# Patient Record
Sex: Male | Born: 1935 | Race: Black or African American | Hispanic: No | Marital: Married | Smoking: Former smoker
Health system: Southern US, Community
[De-identification: ages and names within clinical notes are randomized; demographics above are authoritative.]

## PROBLEM LIST (undated history)

## (undated) DIAGNOSIS — I219 Acute myocardial infarction, unspecified: Secondary | ICD-10-CM

## (undated) DIAGNOSIS — E119 Type 2 diabetes mellitus without complications: Secondary | ICD-10-CM

## (undated) DIAGNOSIS — I10 Essential (primary) hypertension: Secondary | ICD-10-CM

## (undated) DIAGNOSIS — J449 Chronic obstructive pulmonary disease, unspecified: Secondary | ICD-10-CM

## (undated) DIAGNOSIS — E785 Hyperlipidemia, unspecified: Secondary | ICD-10-CM

## (undated) HISTORY — PX: CHOLECYSTECTOMY: SHX55

## (undated) HISTORY — DX: Essential (primary) hypertension: I10

## (undated) HISTORY — DX: Hyperlipidemia, unspecified: E78.5

## (undated) HISTORY — DX: Type 2 diabetes mellitus without complications: E11.9

---

## 2004-05-16 ENCOUNTER — Emergency Department (HOSPITAL_COMMUNITY): Admission: EM | Admit: 2004-05-16 | Discharge: 2004-05-17 | Payer: Self-pay | Admitting: Emergency Medicine

## 2004-05-20 ENCOUNTER — Emergency Department (HOSPITAL_COMMUNITY): Admission: EM | Admit: 2004-05-20 | Discharge: 2004-05-20 | Payer: Self-pay | Admitting: Emergency Medicine

## 2009-03-08 ENCOUNTER — Emergency Department (HOSPITAL_COMMUNITY): Admission: EM | Admit: 2009-03-08 | Discharge: 2009-03-08 | Payer: Self-pay | Admitting: *Deleted

## 2010-03-07 ENCOUNTER — Emergency Department (HOSPITAL_COMMUNITY): Admission: EM | Admit: 2010-03-07 | Discharge: 2010-03-08 | Payer: Self-pay | Admitting: Emergency Medicine

## 2011-01-29 LAB — COMPREHENSIVE METABOLIC PANEL
CO2: 29 mEq/L (ref 19–32)
GFR calc non Af Amer: 54 mL/min — ABNORMAL LOW (ref >60)
Glucose, Bld: 147 mg/dL — ABNORMAL HIGH (ref 70–99)
Sodium: 139 mEq/L (ref 135–145)
Total Bilirubin: 0.9 mg/dL (ref 0.3–1.2)

## 2011-01-29 LAB — GLUCOSE, CAPILLARY: Glucose-Capillary: 187 mg/dL — ABNORMAL HIGH (ref 70–99)

## 2011-01-29 LAB — CBC
HCT: 47.3 % (ref 39.0–52.0)
RBC: 5 MIL/uL (ref 4.22–5.81)
RDW: 13.7 % (ref 11.5–15.5)
WBC: 10.9 10*3/uL — ABNORMAL HIGH (ref 4.0–10.5)

## 2011-01-29 LAB — POCT I-STAT 3, ART BLOOD GAS (G3+)
Acid-Base Excess: 5 mmol/L — ABNORMAL HIGH (ref 0.0–2.0)
O2 Saturation: 95 %
TCO2: 29 mmol/L (ref 0–100)
pH, Arterial: 7.5 — ABNORMAL HIGH (ref 7.350–7.450)
pO2, Arterial: 66 mmHg — ABNORMAL LOW (ref 80.0–100.0)

## 2011-01-29 LAB — CULTURE, BLOOD (ROUTINE X 2)

## 2011-01-29 LAB — URINALYSIS, ROUTINE W REFLEX MICROSCOPIC
Bilirubin Urine: NEGATIVE
Glucose, UA: NEGATIVE mg/dL
Specific Gravity, Urine: 1.015 (ref 1.005–1.030)
pH: 7.5 (ref 5.0–8.0)

## 2011-01-29 LAB — DIFFERENTIAL
Basophils Absolute: 0 10*3/uL (ref 0.0–0.1)
Eosinophils Absolute: 0.2 10*3/uL (ref 0.0–0.7)
Eosinophils Relative: 1 % (ref 0–5)
Lymphs Abs: 1.2 10*3/uL (ref 0.7–4.0)
Monocytes Absolute: 1.4 10*3/uL — ABNORMAL HIGH (ref 0.1–1.0)
Neutro Abs: 8.1 10*3/uL — ABNORMAL HIGH (ref 1.7–7.7)

## 2011-01-29 LAB — URINE CULTURE
Colony Count: NO GROWTH
Culture: NO GROWTH

## 2011-02-20 LAB — BASIC METABOLIC PANEL
BUN: 19 mg/dL (ref 6–23)
CO2: 22 mEq/L (ref 19–32)
Calcium: 8 mg/dL — ABNORMAL LOW (ref 8.4–10.5)
Creatinine, Ser: 1.47 mg/dL (ref 0.4–1.5)
GFR calc non Af Amer: 47 mL/min — ABNORMAL LOW (ref >60)
Glucose, Bld: 117 mg/dL — ABNORMAL HIGH (ref 70–99)
Sodium: 135 mEq/L (ref 135–145)

## 2011-02-20 LAB — COMPREHENSIVE METABOLIC PANEL
ALT: 27 U/L (ref 0–53)
AST: 27 U/L (ref 0–37)
Albumin: 3.5 g/dL (ref 3.5–5.2)
Alkaline Phosphatase: 79 U/L (ref 39–117)
BUN: 20 mg/dL (ref 6–23)
CO2: 22 mEq/L (ref 19–32)
Calcium: 8.8 mg/dL (ref 8.4–10.5)
Chloride: 101 mEq/L (ref 96–112)
GFR calc Af Amer: 42 mL/min — ABNORMAL LOW (ref >60)
Glucose, Bld: 123 mg/dL — ABNORMAL HIGH (ref 70–99)
Total Protein: 6.9 g/dL (ref 6.0–8.3)

## 2011-02-20 LAB — URINALYSIS, ROUTINE W REFLEX MICROSCOPIC
Glucose, UA: NEGATIVE mg/dL
Nitrite: NEGATIVE
Protein, ur: NEGATIVE mg/dL
Specific Gravity, Urine: 1.018 (ref 1.005–1.030)
Urobilinogen, UA: 0.2 mg/dL (ref 0.0–1.0)

## 2011-02-20 LAB — CBC
Hemoglobin: 14.9 g/dL (ref 13.0–17.0)
MCHC: 34.8 g/dL (ref 30.0–36.0)
MCV: 90.2 fL (ref 78.0–100.0)
RDW: 14.8 % (ref 11.5–15.5)
WBC: 8.8 10*3/uL (ref 4.0–10.5)

## 2011-02-20 LAB — POCT CARDIAC MARKERS
Myoglobin, poc: 165 ng/mL (ref 12–200)
Troponin i, poc: <0.05 ng/mL (ref 0.00–0.09)

## 2011-02-20 LAB — URINE MICROSCOPIC-ADD ON

## 2011-02-20 LAB — CULTURE, BLOOD (ROUTINE X 2)

## 2011-02-20 LAB — PROTIME-INR
INR: 1.2 (ref 0.00–1.49)
Prothrombin Time: 15.5 seconds — ABNORMAL HIGH (ref 11.6–15.2)

## 2011-02-20 LAB — DIFFERENTIAL: Monocytes Relative: 11 % (ref 3–12)

## 2011-02-20 LAB — URINE CULTURE

## 2012-01-13 ENCOUNTER — Encounter (HOSPITAL_COMMUNITY): Payer: Self-pay

## 2012-01-13 ENCOUNTER — Emergency Department (HOSPITAL_COMMUNITY): Payer: Medicare Other

## 2012-01-13 ENCOUNTER — Other Ambulatory Visit: Payer: Self-pay

## 2012-01-13 ENCOUNTER — Inpatient Hospital Stay (HOSPITAL_COMMUNITY)
Admission: EM | Admit: 2012-01-13 | Discharge: 2012-01-15 | DRG: 190 | Disposition: A | Discharge status: Home Health | Payer: Medicare Other | Attending: Internal Medicine | Admitting: Internal Medicine

## 2012-01-13 DIAGNOSIS — E119 Type 2 diabetes mellitus without complications: Secondary | ICD-10-CM

## 2012-01-13 DIAGNOSIS — Z79899 Other long term (current) drug therapy: Secondary | ICD-10-CM

## 2012-01-13 DIAGNOSIS — E785 Hyperlipidemia, unspecified: Secondary | ICD-10-CM | POA: Diagnosis present

## 2012-01-13 DIAGNOSIS — I252 Old myocardial infarction: Secondary | ICD-10-CM

## 2012-01-13 DIAGNOSIS — Z7982 Long term (current) use of aspirin: Secondary | ICD-10-CM

## 2012-01-13 DIAGNOSIS — J441 Chronic obstructive pulmonary disease with (acute) exacerbation: Principal | ICD-10-CM | POA: Diagnosis present

## 2012-01-13 DIAGNOSIS — I10 Essential (primary) hypertension: Secondary | ICD-10-CM | POA: Diagnosis present

## 2012-01-13 DIAGNOSIS — J189 Pneumonia, unspecified organism: Secondary | ICD-10-CM

## 2012-01-13 DIAGNOSIS — Z87891 Personal history of nicotine dependence: Secondary | ICD-10-CM

## 2012-01-13 DIAGNOSIS — Z6826 Body mass index (BMI) 26.0-26.9, adult: Secondary | ICD-10-CM

## 2012-01-13 DIAGNOSIS — Z9981 Dependence on supplemental oxygen: Secondary | ICD-10-CM

## 2012-01-13 DIAGNOSIS — R404 Transient alteration of awareness: Secondary | ICD-10-CM | POA: Diagnosis present

## 2012-01-13 DIAGNOSIS — E876 Hypokalemia: Secondary | ICD-10-CM | POA: Diagnosis present

## 2012-01-13 HISTORY — DX: Hyperlipidemia, unspecified: E78.5

## 2012-01-13 HISTORY — DX: Acute myocardial infarction, unspecified: I21.9

## 2012-01-13 HISTORY — DX: Essential (primary) hypertension: I10

## 2012-01-13 HISTORY — DX: Type 2 diabetes mellitus without complications: E11.9

## 2012-01-13 HISTORY — DX: Chronic obstructive pulmonary disease, unspecified: J44.9

## 2012-01-13 LAB — COMPREHENSIVE METABOLIC PANEL
ALT: 26 U/L (ref 0–53)
Alkaline Phosphatase: 95 U/L (ref 39–117)
CO2: 13 mEq/L — ABNORMAL LOW (ref 19–32)
Calcium: 9.7 mg/dL (ref 8.4–10.5)
GFR calc Af Amer: 76 mL/min — ABNORMAL LOW (ref >90)
GFR calc non Af Amer: 65 mL/min — ABNORMAL LOW (ref >90)
Glucose, Bld: 429 mg/dL — ABNORMAL HIGH (ref 70–99)
Sodium: 138 mEq/L (ref 135–145)

## 2012-01-13 LAB — CBC
Hemoglobin: 14.6 g/dL (ref 13.0–17.0)
Hemoglobin: 14.1 g/dL (ref 13.0–17.0)
MCH: 30.4 pg (ref 26.0–34.0)
MCHC: 35.5 g/dL (ref 30.0–36.0)
MCHC: 34.5 g/dL (ref 30.0–36.0)
Platelets: 163 10*3/uL (ref 150–400)
RDW: 14.1 % (ref 11.5–15.5)
RDW: 14.4 % (ref 11.5–15.5)

## 2012-01-13 LAB — DIFFERENTIAL
Basophils Absolute: 0 10*3/uL (ref 0.0–0.1)
Basophils Relative: 0 % (ref 0–1)
Eosinophils Absolute: 0.6 10*3/uL (ref 0.0–0.7)
Monocytes Relative: 10 % (ref 3–12)
Neutrophils Relative %: 64 % (ref 43–77)

## 2012-01-13 LAB — CARDIAC PANEL(CRET KIN+CKTOT+MB+TROPI): Total CK: 161 U/L (ref 7–232)

## 2012-01-13 LAB — POCT I-STAT, CHEM 8
Calcium, Ion: 1.17 mmol/L (ref 1.12–1.32)
Glucose, Bld: 203 mg/dL — ABNORMAL HIGH (ref 70–99)
HCT: 45 % (ref 39.0–52.0)
Hemoglobin: 15.3 g/dL (ref 13.0–17.0)
Potassium: 2.9 mEq/L — ABNORMAL LOW (ref 3.5–5.1)

## 2012-01-13 LAB — GLUCOSE, CAPILLARY
Glucose-Capillary: 381 mg/dL — ABNORMAL HIGH (ref 70–99)
Glucose-Capillary: 354 mg/dL — ABNORMAL HIGH (ref 70–99)

## 2012-01-13 LAB — HEMOGLOBIN A1C: Hgb A1c MFr Bld: 7 % — ABNORMAL HIGH (ref <5.7)

## 2012-01-13 LAB — PRO B NATRIURETIC PEPTIDE: Pro B Natriuretic peptide (BNP): 259.9 pg/mL (ref 0–450)

## 2012-01-13 MED ORDER — METHYLPREDNISOLONE SODIUM SUCC 40 MG IJ SOLR
40.0000 mg | Freq: Every day | INTRAMUSCULAR | Status: DC
  Filled 2012-01-13 (×2): qty 1

## 2012-01-13 MED ORDER — PANTOPRAZOLE SODIUM 40 MG PO TBEC
40.0000 mg | DELAYED_RELEASE_TABLET | Freq: Every day | ORAL | Status: DC
  Administered 2012-01-13 – 2012-01-15 (×3): 40 mg via ORAL
  Filled 2012-01-13 (×2): qty 1

## 2012-01-13 MED ORDER — AMITRIPTYLINE HCL 50 MG PO TABS
50.0000 mg | ORAL_TABLET | Freq: Every day | ORAL | Status: DC
  Administered 2012-01-13 – 2012-01-14 (×2): 50 mg via ORAL
  Filled 2012-01-13 (×3): qty 1

## 2012-01-13 MED ORDER — LATANOPROST 0.005 % OP SOLN
1.0000 [drp] | Freq: Every day | OPHTHALMIC | Status: DC
  Administered 2012-01-13 – 2012-01-14 (×2): 1 [drp] via OPHTHALMIC
  Filled 2012-01-13: qty 2.5

## 2012-01-13 MED ORDER — IPRATROPIUM BROMIDE 0.02 % IN SOLN
0.5000 mg | Freq: Four times a day (QID) | RESPIRATORY_TRACT | Status: DC
  Administered 2012-01-13 – 2012-01-15 (×7): 0.5 mg via RESPIRATORY_TRACT
  Filled 2012-01-13 (×8): qty 2.5

## 2012-01-13 MED ORDER — AZITHROMYCIN 500 MG IV SOLR
500.0000 mg | INTRAVENOUS | Status: DC
  Administered 2012-01-14: 500 mg via INTRAVENOUS
  Filled 2012-01-13 (×2): qty 500

## 2012-01-13 MED ORDER — ONDANSETRON HCL 4 MG/2ML IJ SOLN: 4.0000 mg | Freq: Four times a day (QID) | INTRAMUSCULAR | Status: DC | PRN

## 2012-01-13 MED ORDER — ADULT MULTIVITAMIN W/MINERALS CH
1.0000 | ORAL_TABLET | Freq: Every day | ORAL | Status: DC
  Administered 2012-01-13 – 2012-01-15 (×3): 1 via ORAL
  Filled 2012-01-13 (×3): qty 1

## 2012-01-13 MED ORDER — GI COCKTAIL ~~LOC~~
ORAL | Status: AC
  Filled 2012-01-13: qty 30

## 2012-01-13 MED ORDER — BUDESONIDE 0.5 MG/2ML IN SUSP
0.5000 mg | Freq: Two times a day (BID) | RESPIRATORY_TRACT | Status: DC
  Filled 2012-01-13 (×2): qty 2

## 2012-01-13 MED ORDER — IPRATROPIUM BROMIDE 0.02 % IN SOLN
0.5000 mg | Freq: Once | RESPIRATORY_TRACT | Status: AC
  Administered 2012-01-13: 0.5 mg via RESPIRATORY_TRACT
  Filled 2012-01-13: qty 2.5

## 2012-01-13 MED ORDER — LISINOPRIL 40 MG PO TABS
40.0000 mg | ORAL_TABLET | Freq: Every day | ORAL | Status: DC
Start: 2012-01-13 — End: 2012-01-15
  Administered 2012-01-13 – 2012-01-15 (×3): 40 mg via ORAL
  Filled 2012-01-13 (×3): qty 1

## 2012-01-13 MED ORDER — SODIUM CHLORIDE 0.9 % IJ SOLN
3.0000 mL | Freq: Two times a day (BID) | INTRAMUSCULAR | Status: DC
  Administered 2012-01-14: 3 mL via INTRAVENOUS

## 2012-01-13 MED ORDER — ALBUTEROL SULFATE (5 MG/ML) 0.5% IN NEBU: 5.0000 mg | INHALATION_SOLUTION | RESPIRATORY_TRACT | Status: DC

## 2012-01-13 MED ORDER — LEVALBUTEROL HCL 0.63 MG/3ML IN NEBU
0.6300 mg | INHALATION_SOLUTION | Freq: Four times a day (QID) | RESPIRATORY_TRACT | Status: DC | PRN
  Filled 2012-01-13: qty 3

## 2012-01-13 MED ORDER — METHYLPREDNISOLONE SODIUM SUCC 125 MG IJ SOLR
125.0000 mg | INTRAMUSCULAR | Status: AC
  Administered 2012-01-13: 125 mg via INTRAVENOUS
  Filled 2012-01-13: qty 2

## 2012-01-13 MED ORDER — ONDANSETRON HCL 4 MG PO TABS: 4.0000 mg | ORAL_TABLET | Freq: Four times a day (QID) | ORAL | Status: DC | PRN

## 2012-01-13 MED ORDER — SODIUM CHLORIDE 0.9 % IV SOLN
INTRAVENOUS | Status: DC
  Administered 2012-01-13 – 2012-01-14 (×2): via INTRAVENOUS

## 2012-01-13 MED ORDER — INSULIN ASPART 100 UNIT/ML ~~LOC~~ SOLN
0.0000 [IU] | Freq: Three times a day (TID) | SUBCUTANEOUS | Status: DC
  Administered 2012-01-13 (×2): 15 [IU] via SUBCUTANEOUS
  Administered 2012-01-14 (×3): 5 [IU] via SUBCUTANEOUS
  Administered 2012-01-15: 8 [IU] via SUBCUTANEOUS
  Administered 2012-01-15: 11 [IU] via SUBCUTANEOUS
  Filled 2012-01-13: qty 3

## 2012-01-13 MED ORDER — ALBUTEROL SULFATE (5 MG/ML) 0.5% IN NEBU: 5.0000 mg | INHALATION_SOLUTION | Freq: Four times a day (QID) | RESPIRATORY_TRACT | Status: DC

## 2012-01-13 MED ORDER — ALBUTEROL SULFATE (5 MG/ML) 0.5% IN NEBU
2.5000 mg | INHALATION_SOLUTION | RESPIRATORY_TRACT | Status: AC
  Filled 2012-01-13: qty 0.5

## 2012-01-13 MED ORDER — ACETAMINOPHEN 650 MG RE SUPP: 650.0000 mg | Freq: Four times a day (QID) | RECTAL | Status: DC | PRN

## 2012-01-13 MED ORDER — POTASSIUM CHLORIDE CRYS ER 20 MEQ PO TBCR
40.0000 meq | EXTENDED_RELEASE_TABLET | Freq: Once | ORAL | Status: AC
  Administered 2012-01-13: 40 meq via ORAL
  Filled 2012-01-13: qty 2

## 2012-01-13 MED ORDER — SODIUM CHLORIDE 0.9 % IJ SOLN
3.0000 mL | Freq: Two times a day (BID) | INTRAMUSCULAR | Status: DC
  Administered 2012-01-13 – 2012-01-15 (×3): 3 mL via INTRAVENOUS

## 2012-01-13 MED ORDER — METOPROLOL SUCCINATE ER 25 MG PO TB24
25.0000 mg | ORAL_TABLET | Freq: Every day | ORAL | Status: DC
  Administered 2012-01-13 – 2012-01-15 (×3): 25 mg via ORAL
  Filled 2012-01-13 (×3): qty 1

## 2012-01-13 MED ORDER — ALBUTEROL SULFATE (5 MG/ML) 0.5% IN NEBU
INHALATION_SOLUTION | RESPIRATORY_TRACT | Status: AC
  Filled 2012-01-13: qty 1

## 2012-01-13 MED ORDER — ACETAMINOPHEN 325 MG PO TABS: 650.0000 mg | ORAL_TABLET | Freq: Four times a day (QID) | ORAL | Status: DC | PRN

## 2012-01-13 MED ORDER — ENOXAPARIN SODIUM 40 MG/0.4ML ~~LOC~~ SOLN
40.0000 mg | SUBCUTANEOUS | Status: DC
  Administered 2012-01-13: 40 mg via SUBCUTANEOUS
  Filled 2012-01-13 (×3): qty 0.4

## 2012-01-13 MED ORDER — ATORVASTATIN CALCIUM 40 MG PO TABS
40.0000 mg | ORAL_TABLET | Freq: Every day | ORAL | Status: DC
  Administered 2012-01-13 – 2012-01-14 (×2): 40 mg via ORAL
  Filled 2012-01-13 (×5): qty 1

## 2012-01-13 MED ORDER — ASPIRIN 81 MG PO CHEW
81.0000 mg | CHEWABLE_TABLET | Freq: Every day | ORAL | Status: DC
  Administered 2012-01-13 – 2012-01-15 (×3): 81 mg via ORAL
  Filled 2012-01-13 (×3): qty 1

## 2012-01-13 MED ORDER — METHYLPREDNISOLONE SODIUM SUCC 125 MG IJ SOLR
60.0000 mg | INTRAMUSCULAR | Status: AC
Start: 2012-01-13 — End: 2012-01-13
  Administered 2012-01-13: 60 mg via INTRAVENOUS
  Filled 2012-01-13: qty 2

## 2012-01-13 MED ORDER — INSULIN GLARGINE 100 UNIT/ML ~~LOC~~ SOLN
15.0000 [IU] | Freq: Every day | SUBCUTANEOUS | Status: DC
  Administered 2012-01-13 – 2012-01-15 (×3): 15 [IU] via SUBCUTANEOUS
  Filled 2012-01-13: qty 3

## 2012-01-13 MED ORDER — DEXTROSE 5 % IV SOLN
1.0000 g | Freq: Once | INTRAVENOUS | Status: AC
  Administered 2012-01-13: 1 g via INTRAVENOUS
  Filled 2012-01-13: qty 10

## 2012-01-13 MED ORDER — LEVALBUTEROL HCL 0.63 MG/3ML IN NEBU
0.6300 mg | INHALATION_SOLUTION | Freq: Four times a day (QID) | RESPIRATORY_TRACT | Status: DC
  Filled 2012-01-13 (×4): qty 3

## 2012-01-13 MED ORDER — HYDROCHLOROTHIAZIDE 25 MG PO TABS
25.0000 mg | ORAL_TABLET | Freq: Every day | ORAL | Status: DC
  Administered 2012-01-13 – 2012-01-15 (×3): 25 mg via ORAL
  Filled 2012-01-13 (×3): qty 1

## 2012-01-13 MED ORDER — DEXTROSE 5 % IV SOLN
1.0000 g | INTRAVENOUS | Status: DC
  Administered 2012-01-14: 1 g via INTRAVENOUS
  Filled 2012-01-13 (×2): qty 10

## 2012-01-13 MED ORDER — DEXTROSE 5 % IV SOLN
500.0000 mg | Freq: Once | INTRAVENOUS | Status: AC
  Administered 2012-01-13: 500 mg via INTRAVENOUS
  Filled 2012-01-13: qty 500

## 2012-01-13 MED ORDER — ALBUTEROL (5 MG/ML) CONTINUOUS INHALATION SOLN
10.0000 mg/h | INHALATION_SOLUTION | Freq: Once | RESPIRATORY_TRACT | Status: AC
  Administered 2012-01-13: 10 mg/h via RESPIRATORY_TRACT
  Filled 2012-01-13: qty 20

## 2012-01-13 MED ORDER — ALBUTEROL (5 MG/ML) CONTINUOUS INHALATION SOLN
10.0000 mg/h | INHALATION_SOLUTION | Freq: Once | RESPIRATORY_TRACT | Status: AC
  Administered 2012-01-13: 10 mg/h via RESPIRATORY_TRACT

## 2012-01-13 MED ORDER — ALBUTEROL SULFATE (5 MG/ML) 0.5% IN NEBU
2.5000 mg | INHALATION_SOLUTION | Freq: Four times a day (QID) | RESPIRATORY_TRACT | Status: DC
  Administered 2012-01-13 – 2012-01-15 (×7): 2.5 mg via RESPIRATORY_TRACT
  Filled 2012-01-13 (×7): qty 0.5

## 2012-01-13 MED ORDER — GI COCKTAIL ~~LOC~~
30.0000 mL | Freq: Once | ORAL | Status: AC
  Administered 2012-01-13: 30 mL via ORAL

## 2012-01-13 MED ORDER — BUPROPION HCL ER (XL) 150 MG PO TB24
150.0000 mg | ORAL_TABLET | Freq: Every day | ORAL | Status: DC
  Administered 2012-01-13 – 2012-01-15 (×3): 150 mg via ORAL
  Filled 2012-01-13 (×3): qty 1

## 2012-01-13 NOTE — ED Notes (Signed)
RT notified; will come to assess pt

## 2012-01-13 NOTE — ED Notes (Signed)
Flow manager called to see about bed status.  States still no tele beds available and waiting on some discharges to come up.

## 2012-01-13 NOTE — H&P (Signed)
Jim Shields is an 78 y.o. male.   PCP - is in [facility]. Most of the history is provided patient's wife. Chief Complaint: Shortness of breath. HPI: 78 year old male with history of COPD and hypertension, diabetes mellitus type 2 was brought into the ER by patient's bypass patient was getting increasingly short of breath over last 2 days with cough and productive sputum. Patient denies any chest pain or fever chills. In the patient was found to have bilateral expiratory wheeze and continuous nebulization was given despite which patient is still short of breath and patient has been admitted for further workup. In addition chest x-ray per shows possibility of pneumonia versus atelectasis. Patient denies any nausea vomiting abdominal pain dizziness loss of consciousness any focal deficits.  Past Medical History  Diagnosis Date  . COPD (chronic obstructive pulmonary disease)   . Hypertension   . Diabetes mellitus   . MI (myocardial infarction)     Past Surgical History  Procedure Date  . Cholecystectomy     History reviewed. No pertinent family history. Social History:  reports that he has quit smoking. He does not have any smokeless tobacco history on file. He reports that he does not drink alcohol or use illicit drugs.  Allergies: No Known Allergies  Medications Prior to Admission  Medication Dose Route Frequency Provider Last Rate Last Dose  . albuterol (PROVENTIL) (5 MG/ML) 0.5% nebulizer solution           . albuterol (PROVENTIL,VENTOLIN) solution continuous neb  10 mg/hr Nebulization Once Ward Givens, MD   10 mg/hr at 01/13/12 0059  . albuterol (PROVENTIL,VENTOLIN) solution continuous neb  10 mg/hr Nebulization Once Ward Givens, MD   10 mg/hr at 01/13/12 0333  . azithromycin (ZITHROMAX) 500 mg in dextrose 5 % 250 mL IVPB  500 mg Intravenous Once Ward Givens, MD   500 mg at 01/13/12 0235  . cefTRIAXone (ROCEPHIN) 1 g in dextrose 5 % 50 mL IVPB  1 g Intravenous Once Ward Givens, MD   1 g at 01/13/12 0215  . gi cocktail  30 mL Oral Once Eduard Clos, MD   30 mL at 01/13/12 0444  . ipratropium (ATROVENT) nebulizer solution 0.5 mg  0.5 mg Nebulization Once Ward Givens, MD   0.5 mg at 01/13/12 0100  . ipratropium (ATROVENT) nebulizer solution 0.5 mg  0.5 mg Nebulization Once Ward Givens, MD   0.5 mg at 01/13/12 0333  . methylPREDNISolone sodium succinate (SOLU-MEDROL) 125 MG injection 125 mg  125 mg Intravenous STAT Ward Givens, MD   125 mg at 01/13/12 0110  . potassium chloride SA (K-DUR,KLOR-CON) CR tablet 40 mEq  40 mEq Oral Once Ward Givens, MD   40 mEq at 01/13/12 0218   No current outpatient prescriptions on file as of 01/13/2012.    Results for orders placed during the hospital encounter of 01/13/12 (from the past 48 hour(s))  CBC     Status: Normal   Collection Time   01/13/12 12:52 AM      Component Value Range Comment   WBC 9.1  4.0 - 10.5 (K/uL)    RBC 4.81  4.22 - 5.81 (MIL/uL)    Hemoglobin 14.6  13.0 - 17.0 (g/dL)    HCT 16.1  09.6 - 04.5 (%)    MCV 85.4  78.0 - 100.0 (fL)    MCH 30.4  26.0 - 34.0 (pg)    MCHC 35.5  30.0 -  36.0 (g/dL)    RDW 16.1  09.6 - 04.5 (%)    Platelets 155  150 - 400 (K/uL)   DIFFERENTIAL     Status: Abnormal   Collection Time   01/13/12 12:52 AM      Component Value Range Comment   Neutrophils Relative 64  43 - 77 (%)    Neutro Abs 5.8  1.7 - 7.7 (K/uL)    Lymphocytes Relative 19  12 - 46 (%)    Lymphs Abs 1.7  0.7 - 4.0 (K/uL)    Monocytes Relative 10  3 - 12 (%)    Monocytes Absolute 0.9  0.1 - 1.0 (K/uL)    Eosinophils Relative 7 (*) 0 - 5 (%)    Eosinophils Absolute 0.6  0.0 - 0.7 (K/uL)    Basophils Relative 0  0 - 1 (%)    Basophils Absolute 0.0  0.0 - 0.1 (K/uL)   POCT I-STAT, CHEM 8     Status: Abnormal   Collection Time   01/13/12  1:07 AM      Component Value Range Comment   Sodium 144  135 - 145 (mEq/L)    Potassium 2.9 (*) 3.5 - 5.1 (mEq/L)    Chloride 106  96 - 112 (mEq/L)    BUN 10  6 - 23  (mg/dL)    Creatinine, Ser 4.09  0.50 - 1.35 (mg/dL)    Glucose, Bld 811 (*) 70 - 99 (mg/dL)    Calcium, Ion 9.14  1.12 - 1.32 (mmol/L)    TCO2 23  0 - 100 (mmol/L)    Hemoglobin 15.3  13.0 - 17.0 (g/dL)    HCT 78.2  95.6 - 21.3 (%)   PRO B NATRIURETIC PEPTIDE     Status: Normal   Collection Time   01/13/12  1:44 AM      Component Value Range Comment   Pro B Natriuretic peptide (BNP) 259.9  0 - 450 (pg/mL)    Dg Chest Port 1 View  01/13/2012  *RADIOLOGY REPORT*  Clinical Data:  Shortness of breath, cough, wheezing  PORTABLE CHEST - 1 VIEW  Comparison: Portable exam 0103 hours compared to 03/07/2010  Findings: Enlargement of cardiac silhouette. Pulmonary vascular congestion. Atelectasis versus infiltrate right base. Remaining lungs clear. No pleural effusion or pneumothorax. Bones unremarkable.  IMPRESSION: Atelectasis versus consolidation right base. Enlargement of cardiac silhouette.  Original Report Authenticated By: Lollie Marrow, M.D.    Review of Systems  Constitutional: Negative.   HENT: Negative.   Eyes: Negative.   Respiratory: Positive for cough, sputum production and shortness of breath.   Cardiovascular: Negative.   Gastrointestinal: Negative.   Genitourinary: Negative.   Musculoskeletal: Negative.   Skin: Negative.   Neurological: Negative.   Endo/Heme/Allergies: Negative.   Psychiatric/Behavioral: Negative.     Blood pressure 117/82, pulse 116, temperature 97.7 F (36.5 C), temperature source Oral, resp. rate 27, SpO2 96.00%. Physical Exam  Constitutional: He appears well-developed. No distress.       Moderately nourished.  HENT:  Head: Normocephalic and atraumatic.  Right Ear: External ear normal.  Left Ear: External ear normal.  Eyes: Conjunctivae are normal. Pupils are equal, round, and reactive to light. Right eye exhibits no discharge. Left eye exhibits no discharge. No scleral icterus.  Neck: Normal range of motion. Neck supple.  Cardiovascular:       Sinus  Tachycardia.  Respiratory: Effort normal. No respiratory distress. He has wheezes. He has no rales.  GI: Soft. There is  no tenderness. There is no rebound.       Mild distention.  Musculoskeletal: Normal range of motion. He exhibits no edema and no tenderness.  Neurological: He is alert.       Oriented to his name but most questions are answered by his wife.Follows commands. Moves upper and lower extremities.  Skin: Skin is warm and dry. No rash noted. He is not diaphoretic. No erythema.  Psychiatric: His behavior is normal.     Assessment/Plan #1. COPD exacerbation - patient has been started on Solu-Medrol and nebulizer which we will continue along with Pulmicort and antibiotics. Patient does have a history of CAD so we will check a BNP and cardiac enzymes. #2. Possible pneumonia - continue ceftriaxone and Zithromax for now. #3. Diabetes mellitus2 - isozymes coverage closely follow CBG as patient is on Solu-Medrol. #4. Hypertension - continue present home medications. #5. Hypokalemia - recheck metabolic panel now along with magnesium level.  CODE STATUS - full code.  Eduard Clos 01/13/2012, 6:16 AM

## 2012-01-13 NOTE — ED Notes (Signed)
Patient is resting comfortably. Given meal and drink

## 2012-01-13 NOTE — ED Provider Notes (Signed)
History     CSN: [id number]  Arrival date & time 01/13/12  0030   First MD Initiated Contact with Patient 01/13/12 [phone]      Chief Complaint  Patient presents with  . Shortness of Breath    (Consider location/radiation/quality/duration/timing/severity/associated sxs/prior treatment) HPI  Patient has a history of COPD and uses inhalers at home. Wife states yesterday he started having a cough with white and sometimes dark brown sputum. He also has been having wheezing. They deny chest pain. He has some chronic swelling in his legs and is scheduled to see a vascular doctor about pain in his legs. They deny any fever. Patient presents the EMS report he had a pulse ox of 93% on room air on arrival.   PCP Dr. Susann Givens at family practice at [facility]   Past Medical History  Diagnosis Date  . COPD (chronic obstructive pulmonary disease)   . Hypertension   . Diabetes mellitus   . MI (myocardial infarction)     Glaucoma   Past Surgical History  Procedure Date  . Cholecystectomy    small bowel obstruction Rectal abscess  History reviewed. No pertinent family history.  History  Substance Use Topics  . Smoking status: Former Smoker smoked one pack per day, quit 6 months ago   . Smokeless tobacco: Not on file  . Alcohol Use: No   lives with spouse    Review of Systems  All other systems reviewed and are negative.    Allergies  Review of patient's allergies indicates no known allergies.  Home Medications   Current Outpatient Rx  Name Route Sig Dispense Refill  . ACETAMINOPHEN 325 MG PO TABS Oral Take by mouth every 6 (six) hours as needed. FOR PAIN    . ALBUTEROL SULFATE (2.5 MG/3ML) 0.083% IN NEBU Nebulization Take 2.5 mg by nebulization every 6 (six) hours as needed. FOR SHORTNESS OF BREATH    . IPRATROPIUM-ALBUTEROL 18-103 MCG/ACT IN AERO Inhalation Inhale 2 puffs into the lungs 4 (four) times daily.    Marland Kitchen AMITRIPTYLINE HCL 50 MG PO TABS Oral Take 50 mg by mouth  at bedtime.    . ASPIRIN 81 MG PO CHEW Oral Chew 81 mg by mouth daily.    . ATORVASTATIN CALCIUM 40 MG PO TABS Oral Take 40 mg by mouth daily.    . BUPROPION HCL ER (XL) 150 MG PO TB24 Oral Take 150 mg by mouth daily.    Marland Kitchen HYDROCHLOROTHIAZIDE 25 MG PO TABS Oral Take 25 mg by mouth daily.    . INSULIN GLARGINE 100 UNIT/ML [place] SOLN Subcutaneous Inject 15 Units into the skin daily.    Marland Kitchen LATANOPROST 0.005 % OP SOLN Both Eyes Place 1 drop into both eyes at bedtime.    Marland Kitchen LISINOPRIL 40 MG PO TABS Oral Take 40 mg by mouth daily.    Marland Kitchen METFORMIN HCL 1000 MG PO TABS Oral Take 1,000 mg by mouth 2 (two) times daily with a meal.    . METOPROLOL SUCCINATE ER 25 MG PO TB24 Oral Take 25 mg by mouth daily.    . ADULT MULTIVITAMIN W/MINERALS CH Oral Take 1 tablet by mouth daily.    Marland Kitchen OMEPRAZOLE 20 MG PO CPDR Oral Take 20 mg by mouth daily as needed. FOR ACID REDUCTION      BP 171/85  Pulse 102  Temp(Src) 97.7 F (36.5 C) (Oral)  Resp 25  SpO2 99%  Vital signs normal intensive, tachycardic   Physical Exam  Nursing note and  vitals reviewed. Constitutional: He is oriented to person, place, and time. He appears well-developed and well-nourished.  Non-toxic appearance. He does not appear ill. No distress.  HENT:  Head: Normocephalic and atraumatic.  Right Ear: External ear normal.  Left Ear: External ear normal.  Nose: Nose normal. No mucosal edema or rhinorrhea.  Mouth/Throat: Oropharynx is clear and moist and mucous membranes are normal. No dental abscesses or uvula swelling.  Eyes: Conjunctivae and EOM are normal. Pupils are equal, round, and reactive to light.  Neck: Normal range of motion and full passive range of motion without pain. Neck supple.  Cardiovascular: Normal rate, regular rhythm and normal heart sounds.  Exam reveals no gallop and no friction rub.   No murmur heard. Pulmonary/Chest: He is in respiratory distress. He has wheezes. He has no rhonchi. He has no rales. He exhibits no  tenderness and no crepitus.       Patient appears to be tachypneic at rest. He has minor retractions. He has diffuse expiratory wheezing in all lung fields.  Abdominal: Soft. Normal appearance and bowel sounds are normal. He exhibits no distension. There is no tenderness. There is no rebound and no guarding.  Musculoskeletal: Normal range of motion. He exhibits no edema and no tenderness.       Moves all extremities well.   Neurological: He is alert and oriented to person, place, and time. He has normal strength. No cranial nerve deficit.  Skin: Skin is warm, dry and intact. No rash noted. No erythema. No pallor.  Psychiatric: He has a normal mood and affect. His speech is normal and behavior is normal. His mood appears not anxious.    ED Course  Procedures (including critical care time  Pt given continuous nebulizer with 10 mg albuterol/ atrovent 0.5mg  with solumedrol. After reviewing his CXR he was given rocephin and zithromax IV.  0210 Recheck after continuous nebulizer. Pt has diffuse lower pitched wheezing diffusely, still has some retractions and abdominal breathing.  04:30 PT given second continuous nebulizer of 10 mg albuterol/ 0.5 mg atrovent. Recheck after that shows diffuse rhonchi, rare end expir wheeze but still has abdominal breathing. But patient is feeling better. Wife states this is only his second COPD exacerbation.   1610 Dr Toniann Fail admit to triad team 4 telemetry  Results for orders placed during the hospital encounter of 01/13/12  CBC      Component Value Range   WBC 9.1  4.0 - 10.5 (K/uL)   RBC 4.81  4.22 - 5.81 (MIL/uL)   Hemoglobin 14.6  13.0 - 17.0 (g/dL)   HCT 96.0  45.4 - 09.8 (%)   MCV 85.4  78.0 - 100.0 (fL)   MCH 30.4  26.0 - 34.0 (pg)   MCHC 35.5  30.0 - 36.0 (g/dL)   RDW 11.9  14.7 - 82.9 (%)   Platelets 155  150 - 400 (K/uL)  DIFFERENTIAL      Component Value Range   Neutrophils Relative 64  43 - 77 (%)   Neutro Abs 5.8  1.7 - 7.7 (K/uL)    Lymphocytes Relative 19  12 - 46 (%)   Lymphs Abs 1.7  0.7 - 4.0 (K/uL)   Monocytes Relative 10  3 - 12 (%)   Monocytes Absolute 0.9  0.1 - 1.0 (K/uL)   Eosinophils Relative 7 (*) 0 - 5 (%)   Eosinophils Absolute 0.6  0.0 - 0.7 (K/uL)   Basophils Relative 0  0 - 1 (%)   Basophils Absolute  0.0  0.0 - 0.1 (K/uL)  POCT I-STAT, CHEM 8      Component Value Range   Sodium 144  135 - 145 (mEq/L)   Potassium 2.9 (*) 3.5 - 5.1 (mEq/L)   Chloride 106  96 - 112 (mEq/L)   BUN 10  6 - 23 (mg/dL)   Creatinine, Ser 9.56  0.50 - 1.35 (mg/dL)   Glucose, Bld 213 (*) 70 - 99 (mg/dL)   Calcium, Ion 0.86  5.78 - 1.32 (mmol/L)   TCO2 23  0 - 100 (mmol/L)   Hemoglobin 15.3  13.0 - 17.0 (g/dL)   HCT 46.9  62.9 - 52.8 (%)  PRO B NATRIURETIC PEPTIDE      Component Value Range   Pro B Natriuretic peptide (BNP) 259.9  0 - 450 (pg/mL)   Laboratory interpretation all normal except hypokalemia   Dg Chest Port 1 View  01/13/2012  *RADIOLOGY REPORT*  Clinical Data:  Shortness of breath, cough, wheezing  PORTABLE CHEST - 1 VIEW  Comparison: Portable exam 0103 hours compared to 03/07/2010  Findings: Enlargement of cardiac silhouette. Pulmonary vascular congestion. Atelectasis versus infiltrate right base. Remaining lungs clear. No pleural effusion or pneumothorax. Bones unremarkable.  IMPRESSION: Atelectasis versus consolidation right base. Enlargement of cardiac silhouette.  Original Report Authenticated By: Lollie Marrow, M.D.    Date: 01/13/2012  Rate: 106  Rhythm: sinus tachycardia and premature ventricular contractions (PVC)  QRS Axis: normal  Intervals: normal  ST/T Wave abnormalities: nonspecific ST/T changes  Conduction Disutrbances:none  Narrative Interpretation:   Old EKG Reviewed: unchanged from 03/08/2010     1. COPD with acute exacerbation   2. Community acquired pneumonia    Plan admission   CRITICAL CARE Performed by: Devoria Albe L   Total critical care time:35 min  Critical care time  was exclusive of separately billable procedures and treating other patients.  Critical care was necessary to treat or prevent imminent or life-threatening deterioration.  Critical care was time spent personally by me on the following activities: development of treatment plan with patient and/or surrogate as well as nursing, discussions with consultants, evaluation of patient's response to treatment, examination of patient, obtaining history from patient or surrogate, ordering and performing treatments and interventions, ordering and review of laboratory studies, ordering and review of radiographic studies, pulse oximetry and re-evaluation of patient's condition.    MDM          Ward Givens, MD 01/13/12 [phone]

## 2012-01-13 NOTE — ED Notes (Signed)
Pt c/o SOB. When EMS arrived pt had a room air sat of 93%

## 2012-01-14 ENCOUNTER — Inpatient Hospital Stay (HOSPITAL_COMMUNITY): Payer: Medicare Other

## 2012-01-14 LAB — GLUCOSE, CAPILLARY
Glucose-Capillary: 145 mg/dL — ABNORMAL HIGH (ref 70–99)
Glucose-Capillary: 222 mg/dL — ABNORMAL HIGH (ref 70–99)

## 2012-01-14 MED ORDER — FUROSEMIDE 10 MG/ML IJ SOLN
20.0000 mg | Freq: Once | INTRAMUSCULAR | Status: AC
  Administered 2012-01-14: 20 mg via INTRAVENOUS

## 2012-01-14 MED ORDER — METHYLPREDNISOLONE SODIUM SUCC 125 MG IJ SOLR
60.0000 mg | Freq: Two times a day (BID) | INTRAMUSCULAR | Status: DC
  Administered 2012-01-14 – 2012-01-15 (×3): 60 mg via INTRAVENOUS
  Filled 2012-01-14: qty 2
  Filled 2012-01-14 (×2): qty 0.96

## 2012-01-14 MED ORDER — HALOPERIDOL LACTATE 5 MG/ML IJ SOLN
0.5000 mg | Freq: Four times a day (QID) | INTRAMUSCULAR | Status: DC | PRN
  Filled 2012-01-14: qty 0.1

## 2012-01-14 MED ORDER — LEVOFLOXACIN 750 MG PO TABS
750.0000 mg | ORAL_TABLET | Freq: Every day | ORAL | Status: DC
  Administered 2012-01-14 – 2012-01-15 (×2): 750 mg via ORAL
  Filled 2012-01-14 (×2): qty 1

## 2012-01-14 NOTE — Progress Notes (Signed)
Pt was found to have auditory wheezing in all lobes. Pt satting 98% on room air. Albuterol treatments administered. Pt placed on 2L after treatment. Pt is currently satting 99%. 131/86 HR NSR in 80s. Will continue to monitor. Ramond Craver, RN

## 2012-01-14 NOTE — Progress Notes (Signed)
Utilization review completed. Jim Shields 01/14/2012 

## 2012-01-14 NOTE — Progress Notes (Signed)
Patient having increased SOB, and audible wheezing. Patient's 02 SATs 100 on 2L N/C. Craige Cotta, NP notified. One time dose of 60 mg IV Solumedrol ordered. PRN albuterol 0.25 mg treatment ordered also. After administration, patient's wheezing has decreased. Will continue to monitor.

## 2012-01-14 NOTE — Progress Notes (Signed)
Jim Shields ZOX:[id number] is a 78 y.o. male,  Outpatient Primary MD for the patient is Pcp Not In System  Chief Complaint  Patient presents with  . Shortness of Breath        Subjective:   [facility] today has, No headache, No chest pain, No abdominal pain - No Nausea, No new weakness tingling or numbness, mild Cough - SOB.    Objective:   Filed Vitals:   01/13/12 2100 01/14/12 0500 01/14/12 0756 01/14/12 0801  BP: 165/80 155/87  136/81  Pulse: 86 79    Temp: 97.8 F (36.6 C) 97.6 F (36.4 C)    TempSrc: Oral Oral    Resp: 18 18    Height:      Weight:  81.2 kg (179 lb 0.2 oz)    SpO2: 99% 99% 98%     Wt Readings from Last 3 Encounters:  01/14/12 81.2 kg (179 lb 0.2 oz)     Intake/Output Summary (Last 24 hours) at 01/14/12 0925 Last data filed at 01/14/12 0500  Gross per 24 hour  Intake      0 ml  Output   1350 ml  Net  -1350 ml    Exam Awake Alert, Oriented x 2, No new F.N deficits, Normal affect [place].AT,PERRAL Supple Neck,No JVD, No cervical lymphadenopathy appriciated.  Symmetrical Chest wall movement, Good air movement bilaterally, few rales, minimal wheezing RRR,No Gallops,Rubs or new Murmurs, No Parasternal Heave +ve B.Sounds, Abd Soft, Non tender, No organomegaly appriciated, No rebound -guarding or rigidity. No Cyanosis, Clubbing or edema, No new Rash or bruise     Data Review  CBC  Lab 01/13/12 1235 01/13/12 0107 01/13/12 0052  WBC 9.0 -- 9.1  HGB 14.1 15.3 14.6  HCT 40.9 45.0 41.1  PLT 163 -- 155  MCV 86.5 -- 85.4  MCH 29.8 -- 30.4  MCHC 34.5 -- 35.5  RDW 14.4 -- 14.1  LYMPHSABS -- -- 1.7  MONOABS -- -- 0.9  EOSABS -- -- 0.6  BASOSABS -- -- 0.0  BANDABS -- -- --    Chemistries   Lab 01/13/12 1235 01/13/12 0107  NA 138 144  K 4.0 2.9*  CL 102 106  CO2 13* --  GLUCOSE 429* 203*  BUN 11 10  CREATININE 1.06 0.90  CALCIUM 9.7 --  MG -- --  AST 18 --  ALT 26 --  ALKPHOS 95 --  BILITOT 0.2* --    ------------------------------------------------------------------------------------------------------------------ estimated creatinine clearance is 57.4 ml/min (by C-G formula based on Cr of 1.06). ------------------------------------------------------------------------------------------------------------------  Basename 01/13/12 1235  HGBA1C 7.0*   ------------------------------------------------------------------------------------------------------------------ No results found for this basename: CHOL:2,HDL:2,LDLCALC:2,TRIG:2,CHOLHDL:2,LDLDIRECT:2 in the last 72 hours ------------------------------------------------------------------------------------------------------------------ No results found for this basename: TSH,T4TOTAL,FREET3,T3FREE,THYROIDAB in the last 72 hours ------------------------------------------------------------------------------------------------------------------ No results found for this basename: VITAMINB12:2,FOLATE:2,FERRITIN:2,TIBC:2,IRON:2,RETICCTPCT:2 in the last 72 hours  Coagulation profile No results found for this basename: INR:5,PROTIME:5 in the last 168 hours  No results found for this basename: DDIMER:2 in the last 72 hours  Cardiac Enzymes  Lab 01/13/12 1248  CKMB 5.9*  TROPONINI <0.30  MYOGLOBIN --   ------------------------------------------------------------------------------------------------------------------ No components found with this basename: POCBNP:3  Micro Results No results found for this or any previous visit (from the past 240 hour(s)).  Radiology Reports Dg Chest Port 1 View  01/13/2012  *RADIOLOGY REPORT*  Clinical Data:  Shortness of breath, cough, wheezing  PORTABLE CHEST - 1 VIEW  Comparison: Portable exam 0103 hours compared to 03/07/2010  Findings: Enlargement of cardiac silhouette. Pulmonary vascular congestion.  Atelectasis versus infiltrate right base. Remaining lungs clear. No pleural effusion or pneumothorax. Bones  unremarkable.  IMPRESSION: Atelectasis versus consolidation right base. Enlargement of cardiac silhouette.  Original Report Authenticated By: Lollie Marrow, M.D.    Scheduled Meds:   . albuterol  2.5 mg Nebulization Q6H  . amitriptyline  50 mg Oral QHS  . aspirin  81 mg Oral Daily  . atorvastatin  40 mg Oral Daily  . azithromycin  500 mg Intravenous Q24H  . buPROPion  150 mg Oral Daily  . cefTRIAXone (ROCEPHIN)  IV  1 g Intravenous Q24H  . enoxaparin  40 mg Subcutaneous Q24H  . furosemide  20 mg Intravenous Once  . hydrochlorothiazide  25 mg Oral Daily  . insulin aspart  0-15 Units Subcutaneous TID WC  . insulin glargine  15 Units Subcutaneous Daily  . ipratropium  0.5 mg Nebulization Q6H  . latanoprost  1 drop Both Eyes QHS  . lisinopril  40 mg Oral Daily  . methylPREDNISolone (SOLU-MEDROL) injection  60 mg Intravenous NOW  . methylPREDNISolone (SOLU-MEDROL) injection  60 mg Intravenous Q12H  . metoprolol succinate  25 mg Oral Daily  . mulitivitamin with minerals  1 tablet Oral Daily  . pantoprazole  40 mg Oral Q1200  . potassium chloride  40 mEq Oral Once  . sodium chloride  3 mL Intravenous Q12H  . sodium chloride  3 mL Intravenous Q12H  . DISCONTD: albuterol  5 mg Nebulization Q4H  . DISCONTD: albuterol  5 mg Nebulization Q6H  . DISCONTD: budesonide  0.5 mg Nebulization BID  . DISCONTD: levalbuterol  0.63 mg Nebulization Q6H  . DISCONTD: methylPREDNISolone (SOLU-MEDROL) injection  40 mg Intravenous Daily   Continuous Infusions:   . DISCONTD: sodium chloride 75 mL/hr at 01/14/12 0318   PRN Meds:.acetaminophen, acetaminophen, ondansetron (ZOFRAN) IV, ondansetron, DISCONTD: levalbuterol  Assessment & Plan    #1. COPD exacerbation - patient has been started on Solu-Medrol and nebulizer which we will continue along with Nebs and switch to Po Levaquin today 01-14-12. Clinically mild fluid Over laod today, will give Lasix x 1 now, CXR now, IVF stopped, monitor.   #2. Possible  pneumonia - unlikely - afebrile, WBC NML, will place on PO Levaquin and monitor.   #3. Diabetes mellitus2 - A1c stable, ISS = lantus  Lab Results  Component Value Date   HGBA1C 7.0* 01/13/2012    CBG (last 3)   Basename 01/14/12 0735 01/14/12 0136 01/13/12 2333  GLUCAP 222* 145* 101*      #4. Hypertension - continue present home medications. Monitor closely.   #5. Hypokalemia - replaced and resolved.   #6. Mild delirium - PRN haldol, avoid Benzos.   DVT Prophylaxis  Lovenox   See all Orders from today for further details     Leroy Sea M.D on 01/14/2012 at 9:25 AM  [facility] Office  [phone]

## 2012-01-15 ENCOUNTER — Inpatient Hospital Stay (HOSPITAL_COMMUNITY): Payer: Medicare Other

## 2012-01-15 LAB — GLUCOSE, CAPILLARY
Glucose-Capillary: 299 mg/dL — ABNORMAL HIGH (ref 70–99)
Glucose-Capillary: 329 mg/dL — ABNORMAL HIGH (ref 70–99)

## 2012-01-15 LAB — BASIC METABOLIC PANEL
BUN: 24 mg/dL — ABNORMAL HIGH (ref 6–23)
CO2: 28 mEq/L (ref 19–32)
GFR calc non Af Amer: 55 mL/min — ABNORMAL LOW (ref >90)
Glucose, Bld: 296 mg/dL — ABNORMAL HIGH (ref 70–99)
Potassium: 3.8 mEq/L (ref 3.5–5.1)

## 2012-01-15 LAB — CBC
HCT: 43 % (ref 39.0–52.0)
Hemoglobin: 14.9 g/dL (ref 13.0–17.0)
MCHC: 34.7 g/dL (ref 30.0–36.0)

## 2012-01-15 MED ORDER — XENON XE 133 GAS
20.0000 | GAS_FOR_INHALATION | Freq: Once | RESPIRATORY_TRACT | Status: AC | PRN
  Administered 2012-01-15: 20 via RESPIRATORY_TRACT

## 2012-01-15 MED ORDER — TECHNETIUM TO 99M ALBUMIN AGGREGATED
3.0000 | Freq: Once | INTRAVENOUS | Status: AC | PRN
  Administered 2012-01-15: 3 via INTRAVENOUS

## 2012-01-15 MED ORDER — ALBUTEROL SULFATE (5 MG/ML) 0.5% IN NEBU: 2.5000 mg | INHALATION_SOLUTION | Freq: Four times a day (QID) | RESPIRATORY_TRACT | Status: DC

## 2012-01-15 MED ORDER — ALBUTEROL SULFATE (5 MG/ML) 0.5% IN NEBU: 2.5000 mg | INHALATION_SOLUTION | RESPIRATORY_TRACT | Status: DC

## 2012-01-15 MED ORDER — IPRATROPIUM BROMIDE 0.02 % IN SOLN: 0.5000 mg | Freq: Four times a day (QID) | RESPIRATORY_TRACT | Status: DC

## 2012-01-15 MED ORDER — LEVOFLOXACIN 750 MG PO TABS: 750.0000 mg | ORAL_TABLET | Freq: Every day | ORAL | Status: AC

## 2012-01-15 NOTE — Progress Notes (Signed)
   CARE MANAGEMENT NOTE 01/15/2012  Patient:  Jim Shields, Jim Shields   Account Number:  [id number]  Date Initiated:  01/15/2012  Documentation initiated by:  [facility]  Subjective/Objective Assessment:   Admitted with COPD exacerbation     Action/Plan:   PT eval- no HHPT needed   Anticipated DC Date:  01/15/2012   Anticipated DC Plan:  HOME/SELF CARE      DC Planning Services  CM consult      Choice offered to / List presented to:     DME arranged  NEBULIZER MACHINE  OXYGEN      DME agency  [facility]        Status of service:  Completed, signed off Medicare Important Message given?   (If response is "NO", the following Medicare IM given date fields will be blank) Date Medicare IM given:   Date Additional Medicare IM given:    Discharge Disposition:  HOME/SELF CARE  Per UR Regulation:  Reviewed for med. necessity/level of care/duration of stay  Comments:  01/15/12 Spoke with patient about home nebulizer, he does not have a nebulizer and is agreeable to getting one. Contacted Darian at [facility] and  requested home nebulizer and home oxygen be delivered to patient's room prior to discharge.Jacquelynn Cree RN, BSN, CCM

## 2012-01-15 NOTE — Progress Notes (Signed)
Discharge instructions went over with wife and patient. Discharged by wheelchair and home O2 tank present as well as nebulizer for home. Discharged home to private vehicle. V/S stable.

## 2012-01-15 NOTE — Discharge Summary (Signed)
Patient ID: Jim Shields MRN: [id number] DOB/AGE: [id number] 76 y.o.  Admit date: 01/13/2012 Discharge date: 01/15/2012  Primary Care Physician:  Pcp Not In System  Discharge Diagnoses:    Present on Admission:  .COPD exacerbation .HTN (hypertension) .Diabetes mellitus, type 2 .Hyperlipidemia  Principal Problem:  *COPD exacerbation Active Problems:  HTN (hypertension)  Diabetes mellitus, type 2  Hyperlipidemia   Medication List  As of 01/15/2012  4:18 PM   STOP taking these medications         albuterol (2.5 MG/3ML) 0.083% nebulizer solution         TAKE these medications         acetaminophen 325 MG tablet   Commonly known as: TYLENOL   Take by mouth every 6 (six) hours as needed. FOR PAIN      albuterol (5 MG/ML) 0.5% nebulizer solution   Commonly known as: PROVENTIL   Take 0.5 mLs (2.5 mg total) by nebulization every 6 (six) hours.      albuterol-ipratropium 18-103 MCG/ACT inhaler   Commonly known as: COMBIVENT   Inhale 2 puffs into the lungs 4 (four) times daily.      amitriptyline 50 MG tablet   Commonly known as: ELAVIL   Take 50 mg by mouth at bedtime.      aspirin 81 MG chewable tablet   Chew 81 mg by mouth daily.      atorvastatin 40 MG tablet   Commonly known as: LIPITOR   Take 40 mg by mouth daily.      buPROPion 150 MG 24 hr tablet   Commonly known as: WELLBUTRIN XL   Take 150 mg by mouth daily.      hydrochlorothiazide 25 MG tablet   Commonly known as: HYDRODIURIL   Take 25 mg by mouth daily.      insulin glargine 100 UNIT/ML injection   Commonly known as: LANTUS   Inject 15 Units into the skin daily.      ipratropium 0.02 % nebulizer solution   Commonly known as: ATROVENT   Take 2.5 mLs (0.5 mg total) by nebulization every 6 (six) hours.      latanoprost 0.005 % ophthalmic solution   Commonly known as: XALATAN   Place 1 drop into both eyes at bedtime.      levofloxacin 750 MG tablet   Commonly known as: LEVAQUIN   Take 1  tablet (750 mg total) by mouth daily.      lisinopril 40 MG tablet   Commonly known as: PRINIVIL,ZESTRIL   Take 40 mg by mouth daily.      metFORMIN 1000 MG tablet   Commonly known as: GLUCOPHAGE   Take 1,000 mg by mouth 2 (two) times daily with a meal.      metoprolol succinate 25 MG 24 hr tablet   Commonly known as: TOPROL-XL   Take 25 mg by mouth daily.      mulitivitamin with minerals Tabs   Take 1 tablet by mouth daily.      omeprazole 20 MG capsule   Commonly known as: PRILOSEC   Take 20 mg by mouth daily as needed. FOR ACID REDUCTION            Disposition and Follow-up: Pt will need to follow up with PCP in 2 weeks to ensure that his COPD is well controlled. Please note that oxygen saturation was checked with ambulation and it was determined that pt will have to go home on oxygen continuous, 2-3 L by [place]. Pt's  PCP is at [facility]. Please also note that pt was discharged on Levofloxacin to complete for 7 additional days post discharge and will have to ensure that he has completed the treatment.  Consults:  none  Significant Diagnostic Studies:  Dg Chest Port 1 View 01/13/2012   IMPRESSION: Atelectasis versus consolidation right base. Enlargement of cardiac silhouette.    Brief H and P: 78 year old male with history of COPD and hypertension, diabetes mellitus type 2, was brought into the ER secondary to getting increasingly short of breath over last 2 days with cough and productive sputum. Patient denies any chest pain, fevers, and chills. In the ED patient was found to have bilateral expiratory wheezes and continuous nebulization was given despite which patient was still short of breath and patient has been admitted for further workup. In addition chest x-ray showed possibility of pneumonia versus atelectasis. Patient denied any nausea, vomiting, abdominal pain, dizziness, loss of consciousness, any focal deficits.   Physical Exam on Discharge:  Filed Vitals:   01/15/12 0221  01/15/12 0454 01/15/12 0600 01/15/12 1400  BP:  148/92  124/81  Pulse: 82 80 74 76  Temp:  96.5 F (35.8 C)  98 F (36.7 C)  TempSrc:  Oral  Oral  Resp: 19 20 17 18   Height:      Weight:  81.7 kg (180 lb 1.9 oz)    SpO2: 98% 99% 98% 97%     Intake/Output Summary (Last 24 hours) at 01/15/12 1618 Last data filed at 01/15/12 0800  Gross per 24 hour  Intake    360 ml  Output      0 ml  Net    360 ml    General: Alert, awake, oriented x3, in no acute distress. HEENT: No bruits, no goiter. Heart: Regular rate and rhythm, without murmurs, rubs, gallops. Lungs: Clear to auscultation bilaterally, minimal end expiratory wheezing Abdomen: Soft, nontender, nondistended, positive bowel sounds. Extremities: No clubbing cyanosis or edema with positive pedal pulses. Neuro: Grossly intact, nonfocal.  CBC:    Component Value Date/Time   WBC 10.0 01/15/2012 0645   HGB 14.9 01/15/2012 0645   HCT 43.0 01/15/2012 0645   PLT 163 01/15/2012 0645   MCV 85.1 01/15/2012 0645   NEUTROABS 5.8 01/13/2012 0052   LYMPHSABS 1.7 01/13/2012 0052   MONOABS 0.9 01/13/2012 0052   EOSABS 0.6 01/13/2012 0052   BASOSABS 0.0 01/13/2012 0052    Basic Metabolic Panel:    Component Value Date/Time   NA 135 01/15/2012 0645   K 3.8 01/15/2012 0645   CL 96 01/15/2012 0645   CO2 28 01/15/2012 0645   BUN 24* 01/15/2012 0645   CREATININE 1.22 01/15/2012 0645   GLUCOSE 296* 01/15/2012 0645   CALCIUM 9.5 01/15/2012 0645    Hospital Course:  Principal Problem:  *COPD exacerbation - based on clinical symptoms pt was was treated for COPD exacerbation and PNA - he has responded well to nebulizers, antibiotics, and oxygen treatment while in hospital - his oxygen saturations dropped with ambulation and please note that V/Q scan was negative for pulmonary embolus and this was thought to be secondary to advanced COPD - pt follows at [facility] for management of COPD - I have prescribed levofloxacin to take upon discharge for additional 7 days - I have  also provided pt with nebulizer machine to be able to administer treatments as needed at home - please not that pt has prescription for nebs from [facility] but has no machine  Active Problems:  HTN (hypertension) - this has remained stable   Diabetes mellitus, type 2 - stable during the hospital stay  Time spent on Discharge: Over 30 minutes  - plan of care and diagnosis, diagnostic studies and test results were discussed with pt and family at bedside - pt and  family verbalized understanding  Signed: Debbora Presto 01/15/2012, 4:18 PM  [facility] [phone]

## 2012-01-15 NOTE — Discharge Instructions (Addendum)
Chronic Obstructive Pulmonary Disease  Chronic obstructive pulmonary disease (COPD) is a lung disease. The lungs become damaged, making it hard to get air in and out of your lungs. The damage to your lungs cannot be changed.   HOME CARE   Stop smoking if you smoke. Avoid secondhand smoke.   Only take medicine as told by your doctor.   Talk to your doctor about using cough syrup or over-the-counter medicines.   Drink enough fluids to keep your pee (urine) clear or pale yellow.   Use a humidifier or vaporizer. This may help loosen the thick spit (mucus).   Talk to your doctor about vaccines that help prevent other lung problems (pneumonia and flu vaccines).   Use home oxygen as told by your doctor.   Stay active and exercise.   Eat healthy foods.  GET HELP RIGHT AWAY IF:    Your heart is beating fast.   You become disturbed, confused, shake, or are dazed.   You have trouble breathing.   You have chest pain.   You have a fever.   You cough up thick spit that is yellowish-white or green.   Your breathing becomes worse when you exercise.   You are running out of the medicine you take for your breathing.  MAKE SURE YOU:    Understand these instructions.   Will watch your condition.   Will get help right away if you are not doing well or get worse.  Document Released: 04/15/2008 Document Revised: 10/17/2011 Document Reviewed: 12/28/2010  ExitCare Patient Information 2012 [employer]

## 2012-01-15 NOTE — Progress Notes (Signed)
Inpatient Diabetes Program Recommendations  AACE/ADA: New Consensus Statement on Inpatient Glycemic Control (2009)  Target Ranges:  Prepandial:   less than 140 mg/dL      Peak postprandial:   less than 180 mg/dL (1-2 hours)      Critically ill patients:  140 - 180 mg/dL   Reason for Visit: Results for Jim Shields, Jim Shields (MRN [id number]) as of 01/15/2012 10:42  Ref. Range 01/14/2012 11:34 01/14/2012 16:29 01/14/2012 21:06 01/15/2012 00:22 01/15/2012 07:48  Glucose-Capillary Latest Range: 70-99 mg/dL 409 (H) 811 (H) 914 (H) 169 (H) 299 (H)    Inpatient Diabetes Program Recommendations Insulin - Basal: Increase Lantus to 20 units daily. Correction (SSI): Increase Novolog correction to resistant tid with meals and add HS scale.  Note: Will follow.

## 2012-01-15 NOTE — Progress Notes (Signed)
SATURATION QUALIFICATIONS:  Patient Saturations on Room Air at Rest = 97-98%  Patient Saturations on Room Air while Ambulating = 60%  Patient Saturations on *2 Liters of oxygen while Ambulating =93%

## 2012-01-15 NOTE — Evaluation (Signed)
Physical Therapy Evaluation Patient Details Name: Jim Shields MRN: [id number] DOB: 06/19/1931 Today's Date: 01/15/2012  Problem List:  Patient Active Problem List  Diagnoses  . COPD exacerbation  . HTN (hypertension)  . Diabetes mellitus, type 2  . Hyperlipidemia    Past Medical History:  Past Medical History  Diagnosis Date  . COPD (chronic obstructive pulmonary disease)   . Hypertension   . Diabetes mellitus   . MI (myocardial infarction)    Past Surgical History:  Past Surgical History  Procedure Date  . Cholecystectomy     PT Assessment/Plan/Recommendation PT Assessment Clinical Impression Statement: Patient is a 78 y.o male admitted for COPD exacerbation. Patient currently at baseline level of functioning with the exception of DOE with new O2. Per wife. Discussed postential benefits of cardiac rehab program for patient to increase cardiovasular endurance, however patient and wife deferred referral until they see his primary MD at [facility]. Patient has no acute PT needs at this time. Please re-consult if status changes. PT Recommendation Follow Up Recommendations: Supervision - 24 hour Equipment Recommended: None recommended by PT PT Goals    PT Evaluation Precautions/Restrictions  Precautions Required Braces or Orthoses: No Restrictions Weight Bearing Restrictions: No Other Position/Activity Restrictions: O2 dependent, 2L via nasal cannula Prior Functioning  Home Living Lives With: Spouse Receives Help From: Family Type of Home: House Home Layout: One level Home Access: Stairs to enter Entrance Stairs-Rails: Right;Left;Can reach both Secretary/administrator of Steps: 3 Bathroom Accessibility: Yes How Accessible: Accessible via walker Home Adaptive Equipment: None Prior Function Level of Independence: Independent with basic ADLs;Independent with homemaking with ambulation;Independent with gait;Independent with transfers Driving:  No Cognition Cognition Arousal/Alertness: Awake/alert Overall Cognitive Status: Appears within functional limits for tasks assessed Orientation Level: Oriented X4 Sensation/Coordination Sensation Light Touch: Appears Intact Stereognosis: Not tested Hot/Cold: Not tested Proprioception: Appears Intact Coordination Gross Motor Movements are Fluid and Coordinated: Yes Fine Motor Movements are Fluid and Coordinated: Yes Extremity Assessment RLE Assessment RLE Assessment: Within Functional Limits LLE Assessment LLE Assessment: Within Functional Limits Mobility (including Balance) Bed Mobility Bed Mobility: Yes Supine to Sit: HOB elevated (Comment degrees);7: Independent (30 degrees) Sitting - Scoot to Edge of Bed: 7: Independent Sit to Supine: 7: Independent;HOB elevated (comment degrees) (30 degrees) Transfers Transfers: Yes Sit to Stand: 7: Independent Stand to Sit: 7: Independent Ambulation/Gait Ambulation/Gait: Yes Ambulation/Gait Assistance: 7: Independent Ambulation Distance (Feet): 50 Feet Assistive device: None Gait Pattern: Within Functional Limits Gait velocity: WFL Stairs: No Wheelchair Mobility Wheelchair Mobility: No  Posture/Postural Control Posture/Postural Control: No significant limitations Balance Balance Assessed: No   End of Session PT - End of Session Equipment Utilized During Treatment: Gait belt Activity Tolerance: Patient tolerated treatment well Patient left: in bed;with call bell in reach;with family/visitor present General Behavior During Session: [facility] for tasks performed Cognition: [facility] for tasks performed  Romeo Rabon 01/15/2012, 4:19 PM

## 2012-01-15 NOTE — Progress Notes (Signed)
Took pt off O2 on room air was 97%. When on ambulation pts sats on room air dropped to the 60's. Pt placed on O2 took a few seconds O2 came up to 93%. Will notify MD.

## 2012-01-18 ENCOUNTER — Encounter: Payer: Self-pay | Admitting: Internal Medicine

## 2012-01-18 DIAGNOSIS — Z0000 Encounter for general adult medical examination without abnormal findings: Secondary | ICD-10-CM | POA: Insufficient documentation

## 2012-01-18 DIAGNOSIS — J449 Chronic obstructive pulmonary disease, unspecified: Secondary | ICD-10-CM | POA: Insufficient documentation

## 2012-01-22 ENCOUNTER — Ambulatory Visit: Payer: Self-pay | Admitting: Internal Medicine

## 2012-01-22 DIAGNOSIS — Z0289 Encounter for other administrative examinations: Secondary | ICD-10-CM

## 2012-05-15 ENCOUNTER — Emergency Department (HOSPITAL_COMMUNITY)
Admission: EM | Admit: 2012-05-15 | Discharge: 2012-05-15 | Disposition: A | Discharge status: Home / Self Care | Payer: Medicare Other | Attending: Emergency Medicine | Admitting: Emergency Medicine

## 2012-05-15 ENCOUNTER — Emergency Department (HOSPITAL_COMMUNITY): Payer: Medicare Other

## 2012-05-15 DIAGNOSIS — E119 Type 2 diabetes mellitus without complications: Secondary | ICD-10-CM | POA: Insufficient documentation

## 2012-05-15 DIAGNOSIS — I252 Old myocardial infarction: Secondary | ICD-10-CM | POA: Insufficient documentation

## 2012-05-15 DIAGNOSIS — Z7982 Long term (current) use of aspirin: Secondary | ICD-10-CM | POA: Insufficient documentation

## 2012-05-15 DIAGNOSIS — X58XXXA Exposure to other specified factors, initial encounter: Secondary | ICD-10-CM | POA: Insufficient documentation

## 2012-05-15 DIAGNOSIS — Z79899 Other long term (current) drug therapy: Secondary | ICD-10-CM | POA: Insufficient documentation

## 2012-05-15 DIAGNOSIS — I2510 Atherosclerotic heart disease of native coronary artery without angina pectoris: Secondary | ICD-10-CM | POA: Insufficient documentation

## 2012-05-15 DIAGNOSIS — I10 Essential (primary) hypertension: Secondary | ICD-10-CM | POA: Insufficient documentation

## 2012-05-15 DIAGNOSIS — N281 Cyst of kidney, acquired: Secondary | ICD-10-CM

## 2012-05-15 DIAGNOSIS — E785 Hyperlipidemia, unspecified: Secondary | ICD-10-CM | POA: Insufficient documentation

## 2012-05-15 DIAGNOSIS — Q619 Cystic kidney disease, unspecified: Secondary | ICD-10-CM | POA: Insufficient documentation

## 2012-05-15 DIAGNOSIS — T148XXA Other injury of unspecified body region, initial encounter: Secondary | ICD-10-CM | POA: Insufficient documentation

## 2012-05-15 LAB — POCT I-STAT, CHEM 8
Calcium, Ion: 1.16 mmol/L (ref 1.13–1.30)
HCT: 42 % (ref 39.0–52.0)
Hemoglobin: 14.3 g/dL (ref 13.0–17.0)
TCO2: 27 mmol/L (ref 0–100)

## 2012-05-15 LAB — URINALYSIS, ROUTINE W REFLEX MICROSCOPIC
Ketones, ur: NEGATIVE mg/dL
Leukocytes, UA: NEGATIVE
Nitrite: NEGATIVE
Protein, ur: NEGATIVE mg/dL

## 2012-05-15 MED ORDER — HYDROCODONE-ACETAMINOPHEN 5-325 MG PO TABS
1.0000 | ORAL_TABLET | Freq: Once | ORAL | Status: AC
  Administered 2012-05-15: 1 via ORAL
  Filled 2012-05-15: qty 1

## 2012-05-15 MED ORDER — HYDROCODONE-ACETAMINOPHEN 5-500 MG PO TABS: 1.0000 | ORAL_TABLET | Freq: Four times a day (QID) | ORAL | Status: AC | PRN

## 2012-05-15 MED ORDER — POTASSIUM CHLORIDE CRYS ER 20 MEQ PO TBCR
40.0000 meq | EXTENDED_RELEASE_TABLET | Freq: Once | ORAL | Status: AC
  Administered 2012-05-15: 40 meq via ORAL
  Filled 2012-05-15: qty 2

## 2012-05-15 NOTE — ED Notes (Signed)
Patient has returned from xray 

## 2012-05-15 NOTE — ED Notes (Signed)
Patient transported to X-ray 

## 2012-05-15 NOTE — ED Notes (Signed)
Worsening lower back pain. Wife gave 400 mg ibuprofen three times per day. Wife might think it is an uti.

## 2012-05-15 NOTE — ED Provider Notes (Signed)
History     CSN: [id number]  Arrival date & time 05/15/12  [id number]   First MD Initiated Contact with Patient 05/15/12 1955      Chief Complaint  Patient presents with  . Back Pain   HPI  History provided by the patient and significant other. Patient is a 78 year old male with history of hypertension, diabetes, hyperlipidemia, CAD and COPD who presents with complaints of right lower back pain. Pain has been present for the past 3 days. Pain is worse with certain movements and position. Pain is a throbbing aching pain. Pain does not radiate. Patient has been using 400 mg ibuprofen 3 times a day without significant improvements. Patient denies any other aggravating or alleviating factors. Patient denies any injury or trauma to the area. He denies any strenuous activity or lifting. Patient denies any other associated symptoms. Patient denies any dysuria, hematuria, urinary frequency. Patient denies any fever, chills, sweats, nausea vomiting. Patient denies any chest pain, shortness of breath or abdominal pains. Pain is not radiating to lower extremities. There is no motion a weakness or numbness. Patient denies any urinary or fecal incontinence, urinary retention, or perineal numbness.     Past Medical History  Diagnosis Date  . COPD (chronic obstructive pulmonary disease)   . Hypertension   . Diabetes mellitus   . MI (myocardial infarction)   . Hyperlipidemia 01/13/2012  . HTN (hypertension) 01/13/2012  . Diabetes mellitus, type 2 01/13/2012    Past Surgical History  Procedure Date  . Cholecystectomy     No family history on file.  History  Substance Use Topics  . Smoking status: Former Games developer  . Smokeless tobacco: Not on file  . Alcohol Use: No      Review of Systems  Constitutional: Negative for fever and chills.  Respiratory: Negative for shortness of breath.   Cardiovascular: Negative for chest pain.  Gastrointestinal: Negative for nausea, vomiting and abdominal pain.    Genitourinary: Positive for flank pain. Negative for dysuria, frequency and hematuria.  Musculoskeletal: Positive for back pain.  Skin: Negative for rash.    Allergies  Review of patient's allergies indicates no known allergies.  Home Medications   Current Outpatient Rx  Name Route Sig Dispense Refill  . ACETAMINOPHEN 325 MG PO TABS Oral Take by mouth every 6 (six) hours as needed. FOR PAIN    . ALBUTEROL SULFATE (5 MG/ML) 0.5% IN NEBU Nebulization Take 0.5 mLs (2.5 mg total) by nebulization every 6 (six) hours. 20 mL 1  . IPRATROPIUM-ALBUTEROL 18-103 MCG/ACT IN AERO Inhalation Inhale 2 puffs into the lungs 4 (four) times daily.    Marland Kitchen AMITRIPTYLINE HCL 50 MG PO TABS Oral Take 50 mg by mouth at bedtime.    . ASPIRIN 81 MG PO CHEW Oral Chew 81 mg by mouth daily.    . ATORVASTATIN CALCIUM 40 MG PO TABS Oral Take 40 mg by mouth daily.    . BUPROPION HCL ER (XL) 150 MG PO TB24 Oral Take 150 mg by mouth daily.    Marland Kitchen HYDROCHLOROTHIAZIDE 25 MG PO TABS Oral Take 25 mg by mouth daily.    . INSULIN GLARGINE 100 UNIT/ML [place] SOLN Subcutaneous Inject 18 Units into the skin daily.     . IPRATROPIUM BROMIDE 0.02 % IN SOLN Nebulization Take 2.5 mLs (0.5 mg total) by nebulization every 6 (six) hours. 75 mL 1  . LATANOPROST 0.005 % OP SOLN Both Eyes Place 1 drop into both eyes at bedtime.    Marland Kitchen LISINOPRIL  40 MG PO TABS Oral Take 40 mg by mouth daily.    Marland Kitchen METFORMIN HCL 1000 MG PO TABS Oral Take 1,000 mg by mouth 2 (two) times daily with a meal.    . METOPROLOL SUCCINATE ER 25 MG PO TB24 Oral Take 25 mg by mouth daily.    . ADULT MULTIVITAMIN W/MINERALS CH Oral Take 1 tablet by mouth daily.    Marland Kitchen OMEPRAZOLE 20 MG PO CPDR Oral Take 20 mg by mouth daily as needed. FOR ACID REDUCTION    . PREGABALIN 50 MG PO CAPS Oral Take 50 mg by mouth 3 (three) times daily.      BP 178/89  Pulse 81  Temp 97.9 F (36.6 C) (Oral)  Resp 20  SpO2 100%  Physical Exam  Nursing note and vitals reviewed. Constitutional: He  is oriented to person, place, and time. He appears well-developed and well-nourished. No distress.  HENT:  Head: Normocephalic.  Cardiovascular: Normal rate and regular rhythm.   Pulmonary/Chest: Effort normal and breath sounds normal. No respiratory distress. He has no wheezes. He has no rales.  Abdominal: Soft. There is no tenderness. There is CVA tenderness. There is no rebound, no guarding and no tenderness at McBurney's point.       Right CVA tenderness  Musculoskeletal:       Lumbar back: He exhibits tenderness. He exhibits no bony tenderness.       Back:  Neurological: He is alert and oriented to person, place, and time.  Skin: Skin is warm. No rash noted.  Psychiatric: He has a normal mood and affect.    ED Course  Procedures   Results for orders placed during the hospital encounter of 05/15/12  URINALYSIS, ROUTINE W REFLEX MICROSCOPIC      Component Value Range   Color, Urine YELLOW  YELLOW   APPearance CLEAR  CLEAR   Specific Gravity, Urine 1.021  1.005 - 1.030   pH 6.0  5.0 - 8.0   Glucose, UA NEGATIVE  NEGATIVE mg/dL   Hgb urine dipstick NEGATIVE  NEGATIVE   Bilirubin Urine NEGATIVE  NEGATIVE   Ketones, ur NEGATIVE  NEGATIVE mg/dL   Protein, ur NEGATIVE  NEGATIVE mg/dL   Urobilinogen, UA 1.0  0.0 - 1.0 mg/dL   Nitrite NEGATIVE  NEGATIVE   Leukocytes, UA NEGATIVE  NEGATIVE       Ct Abdomen Pelvis Wo Contrast  05/15/2012  *RADIOLOGY REPORT*  Clinical Data: Right flank pain, rule out kidney stone  CT ABDOMEN AND PELVIS WITHOUT CONTRAST  Technique:  Multidetector CT imaging of the abdomen and pelvis was performed following the standard protocol without intravenous contrast.  Comparison: None.  Findings: Lung bases are clear.  No pericardial fluid.  No focal hepatic lesion.  Cholecystectomy clips the gallbladder fossa.  The pancreas is normal.  Granulomas within the spleen.  The adrenal glands are normal.  There are bilateral low-density renal cysts which has simple  fluid attenuation.  No nephrolithiasis or ureterolithiasis.  No obstructive uropathy.  The stomach, small bowel, appendix, cecum are normal.  Colon and rectosigmoid colon are normal.  Abdominal aorta is normal caliber.  No retroperitoneal or periportal lymphadenopathy.  No free fluid the pelvis.  No distal ureteral stones or bladder stones.  No pelvic lymphadenopathy. Review of  bone windows demonstrates no aggressive osseous lesions.  IMPRESSION:  1.  No nephrolithiasis or ureterolithiasis.  No obstructive uropathy. 2.  Bilateral low-density renal cysts are likely benign but cannot be fully characterized without IV  contrast.  3.  Normal appendix.  Original Report Authenticated By: Genevive Bi, M.D.     1. Muscle strain   2. Renal cyst       MDM  8:05PM patient seen and evaluated. Patient in no acute distress.   Patient reports feeling better after Vicodin. Urine is unremarkable, CT without signs for kidney stone or other concerning findings.     Angus Seller, [place] 05/16/12 [phone]

## 2012-05-16 NOTE — ED Provider Notes (Signed)
Medical screening examination/treatment/procedure(s) were performed by non-physician practitioner and as supervising physician I was immediately available for consultation/collaboration.   Gwyneth Sprout, MD 05/16/12 [phone]

## 2012-06-01 ENCOUNTER — Encounter (HOSPITAL_COMMUNITY): Payer: Self-pay

## 2012-06-01 ENCOUNTER — Other Ambulatory Visit: Payer: Self-pay

## 2012-06-01 ENCOUNTER — Emergency Department (HOSPITAL_COMMUNITY)
Admission: EM | Admit: 2012-06-01 | Discharge: 2012-06-02 | Disposition: A | Discharge status: Home / Self Care | Payer: Medicare Other | Attending: Emergency Medicine | Admitting: Emergency Medicine

## 2012-06-01 ENCOUNTER — Emergency Department (HOSPITAL_COMMUNITY): Payer: Medicare Other

## 2012-06-01 DIAGNOSIS — I10 Essential (primary) hypertension: Secondary | ICD-10-CM | POA: Insufficient documentation

## 2012-06-01 DIAGNOSIS — Z7982 Long term (current) use of aspirin: Secondary | ICD-10-CM | POA: Insufficient documentation

## 2012-06-01 DIAGNOSIS — J4489 Other specified chronic obstructive pulmonary disease: Secondary | ICD-10-CM | POA: Insufficient documentation

## 2012-06-01 DIAGNOSIS — E1149 Type 2 diabetes mellitus with other diabetic neurological complication: Secondary | ICD-10-CM | POA: Insufficient documentation

## 2012-06-01 DIAGNOSIS — E1142 Type 2 diabetes mellitus with diabetic polyneuropathy: Secondary | ICD-10-CM | POA: Insufficient documentation

## 2012-06-01 DIAGNOSIS — I252 Old myocardial infarction: Secondary | ICD-10-CM | POA: Insufficient documentation

## 2012-06-01 DIAGNOSIS — J449 Chronic obstructive pulmonary disease, unspecified: Secondary | ICD-10-CM | POA: Insufficient documentation

## 2012-06-01 DIAGNOSIS — Z79899 Other long term (current) drug therapy: Secondary | ICD-10-CM | POA: Insufficient documentation

## 2012-06-01 DIAGNOSIS — Z87891 Personal history of nicotine dependence: Secondary | ICD-10-CM | POA: Insufficient documentation

## 2012-06-01 DIAGNOSIS — T887XXA Unspecified adverse effect of drug or medicament, initial encounter: Secondary | ICD-10-CM

## 2012-06-01 DIAGNOSIS — E785 Hyperlipidemia, unspecified: Secondary | ICD-10-CM | POA: Insufficient documentation

## 2012-06-01 DIAGNOSIS — G319 Degenerative disease of nervous system, unspecified: Secondary | ICD-10-CM | POA: Insufficient documentation

## 2012-06-01 DIAGNOSIS — R269 Unspecified abnormalities of gait and mobility: Secondary | ICD-10-CM | POA: Insufficient documentation

## 2012-06-01 LAB — CBC WITH DIFFERENTIAL/PLATELET
Basophils Absolute: 0.1 10*3/uL (ref 0.0–0.1)
Eosinophils Relative: 5 % (ref 0–5)
Lymphocytes Relative: 30 % (ref 12–46)
Neutro Abs: 3.1 10*3/uL (ref 1.7–7.7)
Neutrophils Relative %: 53 % (ref 43–77)
Platelets: 155 10*3/uL (ref 150–400)
RDW: 14.1 % (ref 11.5–15.5)
WBC: 6 10*3/uL (ref 4.0–10.5)

## 2012-06-01 LAB — COMPREHENSIVE METABOLIC PANEL
ALT: 29 U/L (ref 0–53)
AST: 25 U/L (ref 0–37)
CO2: 31 mEq/L (ref 19–32)
Calcium: 9.7 mg/dL (ref 8.4–10.5)
Chloride: 103 mEq/L (ref 96–112)
GFR calc non Af Amer: 58 mL/min — ABNORMAL LOW (ref >90)
Sodium: 144 mEq/L (ref 135–145)

## 2012-06-01 LAB — URINALYSIS, ROUTINE W REFLEX MICROSCOPIC
Bilirubin Urine: NEGATIVE
Hgb urine dipstick: NEGATIVE
Specific Gravity, Urine: 1.012 (ref 1.005–1.030)
Urobilinogen, UA: 0.2 mg/dL (ref 0.0–1.0)
pH: 7 (ref 5.0–8.0)

## 2012-06-01 LAB — APTT: aPTT: 36 seconds (ref 24–37)

## 2012-06-01 LAB — PROTIME-INR: INR: 1.03 (ref 0.00–1.49)

## 2012-06-01 LAB — POCT I-STAT TROPONIN I

## 2012-06-01 NOTE — ED Notes (Signed)
First contact with pt. Pt denies complaints at this time.

## 2012-06-01 NOTE — ED Provider Notes (Cosign Needed)
History     CSN: [id number]  Arrival date & time 06/01/12  2002   First MD Initiated Contact with Patient 06/01/12 2003      Chief Complaint  Patient presents with  . Illness    (Consider location/radiation/quality/duration/timing/severity/associated sxs/prior treatment) HPI Comments: The patient is a 78 year old man who is diabetic and has diabetic neuropathy and chronic back pain. He had been placed on Lyrica 2 weeks ago. 3 days ago he developed an unsteady feeling in his gait. As he walks he sometimes goes to the left or right. He was seen by his cardiologist at [facility] day he said for him to go to his primary care doctor to review his medications. The primary care doctor was called, and told him to go to the hospital ED. Cardiologist in family practice Dr. at [facility]; family practice doctor was the one who prescribed Lyrica. There is no will a concern appears to be that he has had a stroke.  Patient is a 78 y.o. male presenting with neurologic complaint. The history is provided by the patient, a relative and medical records. No language interpreter was used.  Neurologic Problem Primary symptoms do not include fever. The symptoms began 3 to 5 days ago (He has had unsteady gait intermittently, for the past 3 days.). The symptoms are unchanged. The neurological symptoms are diffuse. Context: He has been placed on Lyrica 2 weeks ago.  Associated medical issues comments: Diabetes, diabetic neuropathy, coronary artery disease.Marland Kitchen    Past Medical History  Diagnosis Date  . COPD (chronic obstructive pulmonary disease)   . Hypertension   . Diabetes mellitus   . MI (myocardial infarction)   . Hyperlipidemia 01/13/2012  . HTN (hypertension) 01/13/2012  . Diabetes mellitus, type 2 01/13/2012    Past Surgical History  Procedure Date  . Cholecystectomy     No family history on file.  History  Substance Use Topics  . Smoking status: Former  Games developer  . Smokeless tobacco: Not on file  . Alcohol Use: No      Review of Systems  Constitutional: Negative.  Negative for fever and chills.  HENT: Negative.   Eyes: Negative.   Respiratory: Negative.   Cardiovascular: Negative.   Gastrointestinal: Negative.   Genitourinary: Negative.   Musculoskeletal: Negative.   Neurological:       Ataxia   Psychiatric/Behavioral: Negative.     Allergies  Review of patient's allergies indicates no known allergies.  Home Medications   Current Outpatient Rx  Name Route Sig Dispense Refill  . ACETAMINOPHEN 325 MG PO TABS Oral Take by mouth every 6 (six) hours as needed. FOR PAIN    . ALBUTEROL SULFATE (5 MG/ML) 0.5% IN NEBU Nebulization Take 0.5 mLs (2.5 mg total) by nebulization every 6 (six) hours. 20 mL 1  . IPRATROPIUM-ALBUTEROL 18-103 MCG/ACT IN AERO Inhalation Inhale 2 puffs into the lungs 4 (four) times daily.    Marland Kitchen AMITRIPTYLINE HCL 50 MG PO TABS Oral Take 50 mg by mouth at bedtime.    . ASPIRIN 81 MG PO CHEW Oral Chew 81 mg by mouth daily.    . ATORVASTATIN CALCIUM 40 MG PO TABS Oral Take 40 mg by mouth daily.    . BUPROPION HCL ER (XL) 150 MG PO TB24 Oral Take 150 mg by mouth daily.    Marland Kitchen HYDROCHLOROTHIAZIDE 25 MG PO TABS Oral Take 25 mg by mouth daily.    . INSULIN GLARGINE 100 UNIT/ML [place] SOLN Subcutaneous Inject  18 Units into the skin daily.     . IPRATROPIUM BROMIDE 0.02 % IN SOLN Nebulization Take 2.5 mLs (0.5 mg total) by nebulization every 6 (six) hours. 75 mL 1  . LATANOPROST 0.005 % OP SOLN Both Eyes Place 1 drop into both eyes at bedtime.    Marland Kitchen LISINOPRIL 40 MG PO TABS Oral Take 40 mg by mouth daily.    Marland Kitchen METFORMIN HCL 1000 MG PO TABS Oral Take 1,000 mg by mouth 2 (two) times daily with a meal.    . METOPROLOL SUCCINATE ER 25 MG PO TB24 Oral Take 25 mg by mouth daily.    . ADULT MULTIVITAMIN W/MINERALS CH Oral Take 1 tablet by mouth daily.    Marland Kitchen OMEPRAZOLE 20 MG PO CPDR Oral Take 20 mg by mouth daily as needed. FOR ACID  REDUCTION    . PREGABALIN 50 MG PO CAPS Oral Take 50 mg by mouth 3 (three) times daily.      BP 188/88  Temp 98 F (36.7 C) (Oral)  Resp 20  SpO2 100%  Physical Exam  Nursing note and vitals reviewed. Constitutional: He is oriented to person, place, and time. He appears well-developed and well-nourished.       He is somewhat deaf.  HENT:  Head: Normocephalic and atraumatic.  Right Ear: External ear normal.  Left Ear: External ear normal.  Eyes: Conjunctivae and EOM are normal.       No nystagmus.  Neck: Normal range of motion. Neck supple.       No carotid bruit.  Cardiovascular: Normal rate, regular rhythm and normal heart sounds.   Pulmonary/Chest: Effort normal and breath sounds normal.  Abdominal: Soft. Bowel sounds are normal.  Musculoskeletal: Normal range of motion.  Neurological: He is alert and oriented to person, place, and time.       Sensory and motor intact.  Gait observed, pt not visibly ataxic.  Skin: Skin is warm and dry.  Psychiatric: He has a normal mood and affect. His behavior is normal.    ED Course  Procedures (including critical care time)  9:04 PM  Date: 06/01/2012  Rate: 65  Rhythm: normal sinus rhythm  QRS Axis: normal  Intervals: normal  ST/T Wave abnormalities: ST depression and T wave inversion in inferior and lateral leads.  Conduction Disutrbances:  none  Narrative Interpretation: Abnormal EKG  Old EKG Reviewed: changes noted--PVCs present on 01/13/2012 have ceased.   9:16 PM Pt seen --> physical exam performed.  Lab workup ordered.   11:42 PM Results for orders placed during the hospital encounter of 06/01/12  CBC WITH DIFFERENTIAL      Component Value Range   WBC 6.0  4.0 - 10.5 K/uL   RBC 4.97  4.22 - 5.81 MIL/uL   Hemoglobin 15.0  13.0 - 17.0 g/dL   HCT 44.0  10.2 - 72.5 %   MCV 86.7  78.0 - 100.0 fL   MCH 30.2  26.0 - 34.0 pg   MCHC 34.8  30.0 - 36.0 g/dL   RDW 36.6  44.0 - 34.7 %   Platelets 155  150 - 400 K/uL    Neutrophils Relative 53  43 - 77 %   Neutro Abs 3.1  1.7 - 7.7 K/uL   Lymphocytes Relative 30  12 - 46 %   Lymphs Abs 1.8  0.7 - 4.0 K/uL   Monocytes Relative 11  3 - 12 %   Monocytes Absolute 0.7  0.1 - 1.0 K/uL  Eosinophils Relative 5  0 - 5 %   Eosinophils Absolute 0.3  0.0 - 0.7 K/uL   Basophils Relative 1  0 - 1 %   Basophils Absolute 0.1  0.0 - 0.1 K/uL  COMPREHENSIVE METABOLIC PANEL      Component Value Range   Sodium 144  135 - 145 mEq/L   Potassium 3.3 (*) 3.5 - 5.1 mEq/L   Chloride 103  96 - 112 mEq/L   CO2 31  19 - 32 mEq/L   Glucose, Bld 168 (*) 70 - 99 mg/dL   BUN 14  6 - 23 mg/dL   Creatinine, Ser 8.11  0.50 - 1.35 mg/dL   Calcium 9.7  8.4 - 91.4 mg/dL   Total Protein 7.4  6.0 - 8.3 g/dL   Albumin 4.0  3.5 - 5.2 g/dL   AST 25  0 - 37 U/L   ALT 29  0 - 53 U/L   Alkaline Phosphatase 84  39 - 117 U/L   Total Bilirubin 0.3  0.3 - 1.2 mg/dL   GFR calc non Af Amer 58 (*) >90 mL/min   GFR calc Af Amer 68 (*) >90 mL/min  URINALYSIS, ROUTINE W REFLEX MICROSCOPIC      Component Value Range   Color, Urine YELLOW  YELLOW   APPearance CLEAR  CLEAR   Specific Gravity, Urine 1.012  1.005 - 1.030   pH 7.0  5.0 - 8.0   Glucose, UA 500 (*) NEGATIVE mg/dL   Hgb urine dipstick NEGATIVE  NEGATIVE   Bilirubin Urine NEGATIVE  NEGATIVE   Ketones, ur NEGATIVE  NEGATIVE mg/dL   Protein, ur NEGATIVE  NEGATIVE mg/dL   Urobilinogen, UA 0.2  0.0 - 1.0 mg/dL   Nitrite NEGATIVE  NEGATIVE   Leukocytes, UA NEGATIVE  NEGATIVE  PROTIME-INR      Component Value Range   Prothrombin Time 13.7  11.6 - 15.2 seconds   INR 1.03  0.00 - 1.49  APTT      Component Value Range   aPTT 36  24 - 37 seconds  POCT I-STAT TROPONIN I      Component Value Range   Troponin i, poc 0.02  0.00 - 0.08 ng/mL   Comment 3            Ct Abdomen Pelvis Wo Contrast  05/15/2012  *RADIOLOGY REPORT*  Clinical Data: Right flank pain, rule out kidney stone  CT ABDOMEN AND PELVIS WITHOUT CONTRAST  Technique:   Multidetector CT imaging of the abdomen and pelvis was performed following the standard protocol without intravenous contrast.  Comparison: None.  Findings: Lung bases are clear.  No pericardial fluid.  No focal hepatic lesion.  Cholecystectomy clips the gallbladder fossa.  The pancreas is normal.  Granulomas within the spleen.  The adrenal glands are normal.  There are bilateral low-density renal cysts which has simple fluid attenuation.  No nephrolithiasis or ureterolithiasis.  No obstructive uropathy.  The stomach, small bowel, appendix, cecum are normal.  Colon and rectosigmoid colon are normal.  Abdominal aorta is normal caliber.  No retroperitoneal or periportal lymphadenopathy.  No free fluid the pelvis.  No distal ureteral stones or bladder stones.  No pelvic lymphadenopathy. Review of  bone windows demonstrates no aggressive osseous lesions.  IMPRESSION:  1.  No nephrolithiasis or ureterolithiasis.  No obstructive uropathy. 2.  Bilateral low-density renal cysts are likely benign but cannot be fully characterized without IV contrast.  3.  Normal appendix.  Original Report Authenticated By:  Genevive Bi, M.D.   Ct Head Wo Contrast  06/01/2012  *RADIOLOGY REPORT*  Clinical Data: Gait disturbance.  CT HEAD WITHOUT CONTRAST  Technique:  Contiguous axial images were obtained from the base of the skull through the vertex without contrast.  Comparison: 03/08/2009.  Findings: The ventricles are enlarged but remain in the midline without mass effect or shift.  Pedicles have increased slightly in size since the prior head CT from 2010.  The slightly progressive periventricular white matter disease is also noted.  Slightly progressive cerebral atrophy.  No extra-axial fluid collections are identified.  No CT findings for acute hemispheric infarction and/or intracranial hemorrhage. Small lacunar type infarcts are noted.  The brainstem and cerebellum grossly normal and stable.  No mass lesions.  The bony structures  are intact.  The paranasal sinuses mastoid air cells are clear.  IMPRESSION:  1.  Slightly progressive ventriculomegaly, atrophy and white matter disease. 2.  No acute intracranial findings.  Original Report Authenticated By: P. Loralie Champagne, M.D.    Lab workup is essentially negative. Pt likely having an adverse reaction to Lyrica.  Advised to hold Lyrica, and to follow up with [facility] before taking any more Lyrica.   1. Non-dose-related adverse reaction to medication          Carleene Cooper [name], MD 06/03/12 1324

## 2012-06-01 NOTE — Discharge Instructions (Signed)
Mr. Rachel, you had physical examination, laboratory tests, EKG, and CT  X-ray of the brain to check on you after you had difficulty walking straight for the past three days.  Fortunately your tests were all good.  There was no sign of a stroke.  Your problem probably is the result of taking the medicine Lyrica.  Stop taking Lyrica for the time being.  In the morning, call [facility] to make a followup apppointment.  They will tell you if they think you should go back on Lyrica, or if you should take another medicine.

## 2012-06-01 NOTE — ED Notes (Signed)
From home . Marland Kitchen .per wife, pt has had "abnormal gait" for the past couple of days since starting on Lyrica a few days ago. Per EMS, stroke scale negative. PERRLA. CBG 276. Just ate dinner; has not taken evening medications. Pt ambulated to EMS truck without difficulty. AAOx4. resp e/u. NAD. 20g LAC

## 2013-10-27 ENCOUNTER — Observation Stay (HOSPITAL_COMMUNITY)
Admission: EM | Admit: 2013-10-27 | Discharge: 2013-10-28 | Disposition: A | Discharge status: Home / Self Care | Payer: Medicare Other | Attending: Internal Medicine | Admitting: Internal Medicine

## 2013-10-27 ENCOUNTER — Encounter (HOSPITAL_COMMUNITY): Payer: Self-pay | Admitting: Emergency Medicine

## 2013-10-27 DIAGNOSIS — J449 Chronic obstructive pulmonary disease, unspecified: Secondary | ICD-10-CM | POA: Insufficient documentation

## 2013-10-27 DIAGNOSIS — R252 Cramp and spasm: Secondary | ICD-10-CM | POA: Insufficient documentation

## 2013-10-27 DIAGNOSIS — Z7982 Long term (current) use of aspirin: Secondary | ICD-10-CM | POA: Insufficient documentation

## 2013-10-27 DIAGNOSIS — E876 Hypokalemia: Principal | ICD-10-CM | POA: Diagnosis present

## 2013-10-27 DIAGNOSIS — E785 Hyperlipidemia, unspecified: Secondary | ICD-10-CM | POA: Insufficient documentation

## 2013-10-27 DIAGNOSIS — Z87891 Personal history of nicotine dependence: Secondary | ICD-10-CM | POA: Insufficient documentation

## 2013-10-27 DIAGNOSIS — J4489 Other specified chronic obstructive pulmonary disease: Secondary | ICD-10-CM | POA: Insufficient documentation

## 2013-10-27 DIAGNOSIS — E119 Type 2 diabetes mellitus without complications: Secondary | ICD-10-CM | POA: Diagnosis present

## 2013-10-27 DIAGNOSIS — I10 Essential (primary) hypertension: Secondary | ICD-10-CM | POA: Diagnosis present

## 2013-10-27 LAB — CBC WITH DIFFERENTIAL/PLATELET
Basophils Relative: 0 % (ref 0–1)
HCT: 42 % (ref 39.0–52.0)
Hemoglobin: 14.8 g/dL (ref 13.0–17.0)
Lymphs Abs: 1.5 10*3/uL (ref 0.7–4.0)
MCHC: 35.2 g/dL (ref 30.0–36.0)
Monocytes Absolute: 0.8 10*3/uL (ref 0.1–1.0)
Monocytes Relative: 15 % — ABNORMAL HIGH (ref 3–12)
Neutro Abs: 2.8 10*3/uL (ref 1.7–7.7)

## 2013-10-27 LAB — GLUCOSE, CAPILLARY: Glucose-Capillary: 207 mg/dL — ABNORMAL HIGH (ref 70–99)

## 2013-10-27 LAB — COMPREHENSIVE METABOLIC PANEL
Albumin: 3.6 g/dL (ref 3.5–5.2)
BUN: 14 mg/dL (ref 6–23)
CO2: 30 mEq/L (ref 19–32)
Chloride: 96 mEq/L (ref 96–112)
Creatinine, Ser: 1.25 mg/dL (ref 0.50–1.35)
GFR calc Af Amer: 61 mL/min — ABNORMAL LOW (ref >90)
GFR calc non Af Amer: 53 mL/min — ABNORMAL LOW (ref >90)
Glucose, Bld: 261 mg/dL — ABNORMAL HIGH (ref 70–99)
Total Bilirubin: 0.3 mg/dL (ref 0.3–1.2)

## 2013-10-27 MED ORDER — SODIUM CHLORIDE 0.9 % IJ SOLN
3.0000 mL | Freq: Two times a day (BID) | INTRAMUSCULAR | Status: DC
  Administered 2013-10-27: 3 mL via INTRAVENOUS

## 2013-10-27 MED ORDER — ONDANSETRON HCL 4 MG/2ML IJ SOLN
4.0000 mg | Freq: Once | INTRAMUSCULAR | Status: AC
  Administered 2013-10-27: 4 mg via INTRAVENOUS
  Filled 2013-10-27: qty 2

## 2013-10-27 MED ORDER — POTASSIUM CHLORIDE 10 MEQ/100ML IV SOLN
10.0000 meq | INTRAVENOUS | Status: AC
  Administered 2013-10-27 (×2): 10 meq via INTRAVENOUS
  Filled 2013-10-27 (×2): qty 100

## 2013-10-27 MED ORDER — POTASSIUM CHLORIDE IN NACL 40-0.9 MEQ/L-% IV SOLN
INTRAVENOUS | Status: DC
  Administered 2013-10-27: 75 mL/h via INTRAVENOUS
  Filled 2013-10-27 (×2): qty 1000

## 2013-10-27 MED ORDER — HEPARIN SODIUM (PORCINE) 5000 UNIT/ML IJ SOLN
5000.0000 [IU] | Freq: Three times a day (TID) | INTRAMUSCULAR | Status: DC
  Administered 2013-10-28: 5000 [IU] via SUBCUTANEOUS
  Filled 2013-10-27 (×4): qty 1

## 2013-10-27 MED ORDER — AMLODIPINE BESYLATE 5 MG PO TABS
5.0000 mg | ORAL_TABLET | Freq: Every day | ORAL | Status: DC
  Administered 2013-10-28: 5 mg via ORAL
  Filled 2013-10-27: qty 1

## 2013-10-27 MED ORDER — ASPIRIN 81 MG PO CHEW
81.0000 mg | CHEWABLE_TABLET | Freq: Every day | ORAL | Status: DC
  Administered 2013-10-28: 81 mg via ORAL
  Filled 2013-10-27: qty 1

## 2013-10-27 MED ORDER — BUPROPION HCL ER (XL) 150 MG PO TB24
150.0000 mg | ORAL_TABLET | Freq: Every day | ORAL | Status: DC
  Administered 2013-10-28: 150 mg via ORAL
  Filled 2013-10-27: qty 1

## 2013-10-27 MED ORDER — POTASSIUM CHLORIDE CRYS ER 20 MEQ PO TBCR: 30.0000 meq | EXTENDED_RELEASE_TABLET | Freq: Two times a day (BID) | ORAL | Status: DC

## 2013-10-27 MED ORDER — INSULIN ASPART 100 UNIT/ML ~~LOC~~ SOLN
0.0000 [IU] | Freq: Three times a day (TID) | SUBCUTANEOUS | Status: DC
  Administered 2013-10-28 (×2): 3 [IU] via SUBCUTANEOUS

## 2013-10-27 MED ORDER — ONDANSETRON HCL 4 MG PO TABS: 4.0000 mg | ORAL_TABLET | Freq: Four times a day (QID) | ORAL | Status: DC | PRN

## 2013-10-27 MED ORDER — ONDANSETRON HCL 4 MG/2ML IJ SOLN: 4.0000 mg | Freq: Four times a day (QID) | INTRAMUSCULAR | Status: DC | PRN

## 2013-10-27 MED ORDER — POTASSIUM CHLORIDE CRYS ER 20 MEQ PO TBCR
40.0000 meq | EXTENDED_RELEASE_TABLET | Freq: Once | ORAL | Status: AC
  Administered 2013-10-27: 40 meq via ORAL
  Filled 2013-10-27: qty 2

## 2013-10-27 MED ORDER — LISINOPRIL 40 MG PO TABS
40.0000 mg | ORAL_TABLET | Freq: Every day | ORAL | Status: DC
  Administered 2013-10-28: 40 mg via ORAL
  Filled 2013-10-27: qty 1

## 2013-10-27 MED ORDER — METOPROLOL SUCCINATE ER 25 MG PO TB24
25.0000 mg | ORAL_TABLET | Freq: Every day | ORAL | Status: DC
  Administered 2013-10-28: 25 mg via ORAL
  Filled 2013-10-27: qty 1

## 2013-10-27 MED ORDER — INSULIN GLARGINE 100 UNIT/ML ~~LOC~~ SOLN
20.0000 [IU] | Freq: Every morning | SUBCUTANEOUS | Status: DC
  Administered 2013-10-28: 20 [IU] via SUBCUTANEOUS
  Filled 2013-10-27: qty 0.2

## 2013-10-27 MED ORDER — MELOXICAM 7.5 MG PO TABS
7.5000 mg | ORAL_TABLET | Freq: Every day | ORAL | Status: DC
  Administered 2013-10-28: 7.5 mg via ORAL
  Filled 2013-10-27: qty 1

## 2013-10-27 MED ORDER — INSULIN ASPART 100 UNIT/ML ~~LOC~~ SOLN
0.0000 [IU] | Freq: Every day | SUBCUTANEOUS | Status: DC
  Administered 2013-10-27: 2 [IU] via SUBCUTANEOUS

## 2013-10-27 NOTE — ED Notes (Signed)
Report attempt x 1 

## 2013-10-27 NOTE — ED Provider Notes (Signed)
CSN: [id number]     Arrival date & time 10/27/13  1826 History   First MD Initiated Contact with Patient 10/27/13 2003     Chief Complaint  Patient presents with  . Abnormal Lab   (Consider location/radiation/quality/duration/timing/severity/associated sxs/prior Treatment) HPI Comments: Chief complaint: Abnormal lab  Patient is 79 year old male past medical history for COPD hypertension diabetes he comes in with chief complaint of abnormal labs. Patient reports he would to see PCP today and had labs drawn. Discovered that potassium of 2.9. Patient has been otherwise well. Complains only of some generalized weakness and cramping in his bilateral arms. This is been a problem for several days. Is unchanged. Is mild in severity. It is been waxing and waning. No treatments attempted. Patient denies any chest pain, shortness of breath.   Past Medical History  Diagnosis Date  . COPD (chronic obstructive pulmonary disease)   . Hypertension   . Diabetes mellitus   . MI (myocardial infarction)   . Hyperlipidemia 01/13/2012  . HTN (hypertension) 01/13/2012  . Diabetes mellitus, type 2 01/13/2012   Past Surgical History  Procedure Laterality Date  . Cholecystectomy     History reviewed. No pertinent family history. History  Substance Use Topics  . Smoking status: Former Games developer  . Smokeless tobacco: Not on file  . Alcohol Use: No    Review of Systems  Constitutional: Negative for fatigue.  Respiratory: Negative for shortness of breath.   Cardiovascular: Negative for chest pain.  Gastrointestinal: Negative for abdominal pain.  Genitourinary: Negative for dysuria.  Musculoskeletal: Positive for myalgias (forearm cramping and pain). Negative for back pain.  Skin: Negative for rash.  Neurological: Positive for weakness (subjective weakness bilateral armas). Negative for headaches.  Psychiatric/Behavioral: Negative for agitation.  All other systems reviewed and are negative.    Allergies   Review of patient's allergies indicates no known allergies.  Home Medications   Current Outpatient Rx  Name  Route  Sig  Dispense  Refill  . amLODipine (NORVASC) 5 MG tablet   Oral   Take 5 mg by mouth daily.         Marland Kitchen aspirin 81 MG chewable tablet   Oral   Chew 81 mg by mouth daily.         Marland Kitchen buPROPion (WELLBUTRIN XL) 150 MG 24 hr tablet   Oral   Take 150 mg by mouth daily.         . hydrochlorothiazide (HYDRODIURIL) 25 MG tablet   Oral   Take 25 mg by mouth daily.         . insulin glargine (LANTUS) 100 UNIT/ML injection   Subcutaneous   Inject 20 Units into the skin every morning.          . latanoprost (XALATAN) 0.005 % ophthalmic solution   Both Eyes   Place 1 drop into both eyes at bedtime.         Marland Kitchen lisinopril (PRINIVIL,ZESTRIL) 40 MG tablet   Oral   Take 40 mg by mouth daily.         . meloxicam (MOBIC) 7.5 MG tablet   Oral   Take 7.5 mg by mouth daily.         . metFORMIN (GLUCOPHAGE) 1000 MG tablet   Oral   Take 1,000 mg by mouth 2 (two) times daily with a meal.         . metoprolol succinate (TOPROL-XL) 25 MG 24 hr tablet   Oral   Take 25 mg by  mouth daily.         . Multiple Vitamin (MULITIVITAMIN WITH MINERALS) TABS   Oral   Take 1 tablet by mouth daily.          BP 150/79  Pulse 57  Temp(Src) 97.6 F (36.4 C) (Oral)  Resp 19  Ht 5\' 11"  (1.803 m)  Wt 181 lb (82.101 kg)  BMI 25.26 kg/m2  SpO2 99% Physical Exam  Nursing note and vitals reviewed. Constitutional: He is oriented to person, place, and time. He appears well-developed and well-nourished.  HENT:  Head: Normocephalic and atraumatic.  Eyes: EOM are normal. Pupils are equal, round, and reactive to light.  Neck: Normal range of motion.  Cardiovascular: Regular rhythm and intact distal pulses.   Bradycardic  Pulmonary/Chest: Effort normal and breath sounds normal. No respiratory distress. He has no wheezes. He exhibits no tenderness.  Abdominal: Soft. He  exhibits no distension. There is no tenderness. There is no rebound and no guarding.  Musculoskeletal: Normal range of motion.  Neurological: He is alert and oriented to person, place, and time. No cranial nerve deficit. He exhibits normal muscle tone. Coordination normal.  Patient states she has generalized weakness of bilateral upper extremities. However on exam patient has 5 out of 5 strength throughout entire extremity. Patient does endorse some muscle pain and cramping. Neurovascularly intact  Skin: Skin is warm and dry. No rash noted.  Psychiatric: He has a normal mood and affect. His behavior is normal. Judgment and thought content normal.    ED Course  Procedures (including critical care time) Labs Review Labs Reviewed  CBC WITH DIFFERENTIAL - Abnormal; Notable for the following:    Platelets 149 (*)    Monocytes Relative 15 (*)    All other components within normal limits  COMPREHENSIVE METABOLIC PANEL - Abnormal; Notable for the following:    Potassium 2.6 (*)    Glucose, Bld 261 (*)    GFR calc non Af Amer 53 (*)    GFR calc Af Amer 61 (*)    All other components within normal limits   Imaging Review No results found.  EKG Interpretation    Date/Time:  Wednesday October 27 2013 18:46:16 EST Ventricular Rate:  60 PR Interval:  182 QRS Duration: 88 QT Interval:  422 QTC Calculation: 422 R Axis:   40 Text Interpretation:  Normal sinus rhythm ST \\T \ T wave abnormality, consider inferolateral ischemia Abnormal ECG Confirmed by HORTON  MD, COURTNEY ([id number]) on 10/27/2013 9:15:31 PM            MDM   1. Hypokalemia     Afebrile vital signs stable on arrival. Patient with hypokalemia at PCP office. BMP confirms significant hypokalemia. Patient complaining of bilateral forearm muscle cramping. No chest pain shortness of breath et Karie Soda. However muscle cramping and pain likely secondary to hypokalemia. Repletion was begun emergency Department with by mouth and IV  potassium.  Medicine consult for admission. No further acute issues in ED   Bridgett Larsson, MD 10/27/13 2144

## 2013-10-27 NOTE — ED Notes (Signed)
Pt c/o IV potassium, rate slowed to 40ml/hr.

## 2013-10-27 NOTE — H&P (Signed)
@LOGO @ [facility] History and Physical  Patient: Jim Shields  WUJ:[id number]  DOB: 1931/01/18  DOS: the patient was seen and examined on 10/27/2013 PCP: Default, Provider, MD  Chief Complaint: Muscle cramps  HPI: Aryav Wimberly is a 79 y.o. male with Past medical history of hypertension, diabetes mellitus, dyslipidemia, COPD. The patient is coming from home. The patient presents with complaints of cramps in his muscles in his PCP. He stays in performance routine labs and he was found to be hypokalemic and was recommended to the hospital. The patient denies any complaints of any fever, chills, headache, cough, chest pain, palpitation, shortness of breath, orthopnea, PND, nausea, vomiting, abdominal pain, diarrhea, constipation, active bleeding, burning urination, dizziness, pedal edema,  focal neurological deficit. He mentions he is compliant with his medication has been taking hydrochlorothiazide regularly. There is no change in his medications recently   Review of Systems: as mentioned in the history of present illness.  A Comprehensive review of the other systems is negative.  Past Medical History  Diagnosis Date  . COPD (chronic obstructive pulmonary disease)   . Hypertension   . Diabetes mellitus   . MI (myocardial infarction)   . Hyperlipidemia 01/13/2012  . HTN (hypertension) 01/13/2012  . Diabetes mellitus, type 2 01/13/2012   Past Surgical History  Procedure Laterality Date  . Cholecystectomy     Social History:  reports that he has quit smoking. He does not have any smokeless tobacco history on file. He reports that he does not drink alcohol or use illicit drugs. Independent for most of his  ADL.  No Known Allergies  History reviewed. No pertinent family history.  Prior to Admission medications   Medication Sig Start Date End Date Taking? Authorizing Provider  amLODipine (NORVASC) 5 MG tablet Take 5 mg by mouth daily.   Yes Historical Provider, MD  aspirin  81 MG chewable tablet Chew 81 mg by mouth daily.   Yes Historical Provider, MD  buPROPion (WELLBUTRIN XL) 150 MG 24 hr tablet Take 150 mg by mouth daily.   Yes Historical Provider, MD  hydrochlorothiazide (HYDRODIURIL) 25 MG tablet Take 25 mg by mouth daily.   Yes Historical Provider, MD  insulin glargine (LANTUS) 100 UNIT/ML injection Inject 20 Units into the skin every morning.    Yes Historical Provider, MD  latanoprost (XALATAN) 0.005 % ophthalmic solution Place 1 drop into both eyes at bedtime.   Yes Historical Provider, MD  lisinopril (PRINIVIL,ZESTRIL) 40 MG tablet Take 40 mg by mouth daily.   Yes Historical Provider, MD  meloxicam (MOBIC) 7.5 MG tablet Take 7.5 mg by mouth daily.   Yes Historical Provider, MD  metFORMIN (GLUCOPHAGE) 1000 MG tablet Take 1,000 mg by mouth 2 (two) times daily with a meal.   Yes Historical Provider, MD  metoprolol succinate (TOPROL-XL) 25 MG 24 hr tablet Take 25 mg by mouth daily.   Yes Historical Provider, MD  Multiple Vitamin (MULITIVITAMIN WITH MINERALS) TABS Take 1 tablet by mouth daily.   Yes Historical Provider, MD    Physical Exam: Filed Vitals:   10/27/13 2030 10/27/13 2100 10/27/13 2130 10/27/13 2228  BP: 159/82 150/79 144/77 178/85  Pulse: 57 57 74 65  Temp:    97.5 F (36.4 C)  TempSrc:    Oral  Resp: 24 19 16 18   Height:    5\' 9"  (1.753 m)  Weight:    81.557 kg (179 lb 12.8 oz)  SpO2: 99% 99% 95% 100%    General: Alert, Awake and  Oriented to Time, Place and Person. Appear in mild distress Eyes: PERRL ENT: Oral Mucosa clear moist. Neck: no JVD Cardiovascular: S1 and S2 Present, no Murmur, Peripheral Pulses Present Respiratory: Bilateral Air entry equal and Decreased, Clear to Auscultation,  no Crackles,no wheezes Abdomen: Bowel Sound Present, Soft and Non tender Skin: no Rash Extremities: no Pedal edema, no calf tenderness Neurologic: Grossly Unremarkable.  Labs on Admission:  CBC:  Recent Labs Lab 10/27/13 1848  WBC 5.3   NEUTROABS 2.8  HGB 14.8  HCT 42.0  MCV 85.4  PLT 149*    CMP     Component Value Date/Time   NA 139 10/27/2013 1848   K 2.6* 10/27/2013 1848   CL 96 10/27/2013 1848   CO2 30 10/27/2013 1848   GLUCOSE 261* 10/27/2013 1848   BUN 14 10/27/2013 1848   CREATININE 1.25 10/27/2013 1848   CALCIUM 9.1 10/27/2013 1848   PROT 6.8 10/27/2013 1848   ALBUMIN 3.6 10/27/2013 1848   AST 22 10/27/2013 1848   ALT 19 10/27/2013 1848   ALKPHOS 72 10/27/2013 1848   BILITOT 0.3 10/27/2013 1848   GFRNONAA 53* 10/27/2013 1848   GFRAA 61* 10/27/2013 1848    No results found for this basename: LIPASE, AMYLASE,  in the last 168 hours No results found for this basename: AMMONIA,  in the last 168 hours  No results found for this basename: CKTOTAL, CKMB, CKMBINDEX, TROPONINI,  in the last 168 hours BNP (last 3 results) No results found for this basename: PROBNP,  in the last 8760 hours  Radiological Exams on Admission: No results found.  EKG: Independently reviewed. normal sinus rhythm, nonspecific ST and T waves changes.  Assessment/Plan Active Problems:   HTN (hypertension)   Diabetes mellitus, type 2   COPD (chronic obstructive pulmonary disease)   Hypokalemia   1. Hypokalemia The patient is presenting hypokalemia. Admitted for observation under telemetry. I would continue him on IV potassium supplements as well as IV normal saline with 40 meq potassium. A repeat his BMP at midnight. If he continues to remain low on potassium I would place another oral dose. I would also check magnesium level and replace if it is low. More likely causes hydrochlorothiazide therefore I would hold it.  2. Hypertension holding hydrochlorothiazide and continue the rest of the medications  3. Diabetes Continue home antihyperglycemic Place him on sliding scale  4. COPD Stable at present  DVT Prophylaxis: subcutaneous Heparin Nutrition: Cardiac and diabetic diet  Code Status: Full  Family  Communication: Family was present at bedside, opportunity was given to ask question and all questions were answered satisfactorily at the time of interview. Disposition: Admitted to observation in telemetry unit.  Author: Lynden Oxford, MD [facility] Pager: [phone] 10/27/2013, 11:15 PM    If 7PM-7AM, please contact night-coverage [email or web address] Password [email or web address]

## 2013-10-27 NOTE — ED Notes (Signed)
Pt sent here by PCP for eval of hypokalemia; pt sts some generalized weakness and cramping in arms

## 2013-10-28 ENCOUNTER — Encounter (HOSPITAL_COMMUNITY): Payer: Self-pay | Admitting: *Deleted

## 2013-10-28 DIAGNOSIS — I10 Essential (primary) hypertension: Secondary | ICD-10-CM

## 2013-10-28 DIAGNOSIS — E119 Type 2 diabetes mellitus without complications: Secondary | ICD-10-CM

## 2013-10-28 DIAGNOSIS — J449 Chronic obstructive pulmonary disease, unspecified: Secondary | ICD-10-CM

## 2013-10-28 DIAGNOSIS — E876 Hypokalemia: Secondary | ICD-10-CM

## 2013-10-28 LAB — BASIC METABOLIC PANEL
BUN: 14 mg/dL (ref 6–23)
Calcium: 8.5 mg/dL (ref 8.4–10.5)
Creatinine, Ser: 1.14 mg/dL (ref 0.50–1.35)
GFR calc Af Amer: 68 mL/min — ABNORMAL LOW (ref >90)
GFR calc non Af Amer: 59 mL/min — ABNORMAL LOW (ref >90)

## 2013-10-28 LAB — CBC WITH DIFFERENTIAL/PLATELET
Basophils Absolute: 0 10*3/uL (ref 0.0–0.1)
Basophils Relative: 1 % (ref 0–1)
Eosinophils Absolute: 0.2 10*3/uL (ref 0.0–0.7)
Eosinophils Relative: 3 % (ref 0–5)
HCT: 39.7 % (ref 39.0–52.0)
Hemoglobin: 14 g/dL (ref 13.0–17.0)
Lymphocytes Relative: 30 % (ref 12–46)
Lymphs Abs: 1.3 10*3/uL (ref 0.7–4.0)
MCV: 84.6 fL (ref 78.0–100.0)
Monocytes Absolute: 0.5 10*3/uL (ref 0.1–1.0)
Monocytes Relative: 11 % (ref 3–12)
Neutro Abs: 2.4 10*3/uL (ref 1.7–7.7)
Neutrophils Relative %: 55 % (ref 43–77)
RBC: 4.69 MIL/uL (ref 4.22–5.81)
RDW: 13.3 % (ref 11.5–15.5)
WBC: 4.4 10*3/uL (ref 4.0–10.5)

## 2013-10-28 LAB — COMPREHENSIVE METABOLIC PANEL
AST: 15 U/L (ref 0–37)
Albumin: 3.4 g/dL — ABNORMAL LOW (ref 3.5–5.2)
Alkaline Phosphatase: 66 U/L (ref 39–117)
BUN: 11 mg/dL (ref 6–23)
CO2: 29 mEq/L (ref 19–32)
Calcium: 8.8 mg/dL (ref 8.4–10.5)
Chloride: 100 mEq/L (ref 96–112)
Creatinine, Ser: 1.1 mg/dL (ref 0.50–1.35)
GFR calc non Af Amer: 61 mL/min — ABNORMAL LOW (ref >90)
Potassium: 3.1 mEq/L — ABNORMAL LOW (ref 3.5–5.1)
Total Bilirubin: 0.4 mg/dL (ref 0.3–1.2)

## 2013-10-28 LAB — GLUCOSE, CAPILLARY: Glucose-Capillary: 192 mg/dL — ABNORMAL HIGH (ref 70–99)

## 2013-10-28 MED ORDER — POTASSIUM CHLORIDE CRYS ER 20 MEQ PO TBCR
40.0000 meq | EXTENDED_RELEASE_TABLET | Freq: Two times a day (BID) | ORAL | Status: DC
  Administered 2013-10-28 (×2): 40 meq via ORAL
  Filled 2013-10-28 (×3): qty 2

## 2013-10-28 MED ORDER — MAGNESIUM OXIDE 400 (241.3 MG) MG PO TABS: 400.0000 mg | ORAL_TABLET | Freq: Two times a day (BID) | ORAL | Status: DC

## 2013-10-28 MED ORDER — POTASSIUM CHLORIDE CRYS ER 20 MEQ PO TBCR
40.0000 meq | EXTENDED_RELEASE_TABLET | ORAL | Status: DC
  Administered 2013-10-28: 40 meq via ORAL

## 2013-10-28 MED ORDER — MAGNESIUM OXIDE 400 (241.3 MG) MG PO TABS
400.0000 mg | ORAL_TABLET | Freq: Two times a day (BID) | ORAL | Status: DC
  Administered 2013-10-28: 400 mg via ORAL
  Filled 2013-10-28 (×2): qty 1

## 2013-10-28 MED ORDER — MAGNESIUM SULFATE IN D5W 10-5 MG/ML-% IV SOLN
1.0000 g | Freq: Once | INTRAVENOUS | Status: AC
  Administered 2013-10-28: 1 g via INTRAVENOUS
  Filled 2013-10-28: qty 100

## 2013-10-28 NOTE — Progress Notes (Signed)
UR completed 

## 2013-10-28 NOTE — ED Provider Notes (Signed)
I saw and evaluated the patient, reviewed the resident's note and I agree with the findings and plan.  EKG Interpretation    Date/Time:  Wednesday October 27 2013 18:46:16 EST Ventricular Rate:  60 PR Interval:  182 QRS Duration: 88 QT Interval:  422 QTC Calculation: 422 R Axis:   40 Text Interpretation:  Normal sinus rhythm ST \\T \ T wave abnormality, consider inferolateral ischemia Abnormal ECG Confirmed by HORTON  MD, Toni Amend ([id number]) on 10/27/2013 9:15:31 PM           Patient presents with hypokalemia.  Is assymptomatic.  VS stable and EKG with T wave flattening.  Patient K in ED 2.6.  Given IV and PO supplementation.  Given age and low level of K will admit to a telemetry bed for monitoring, supplementation, and recheck of K tomorrow.  Shon Baton, MD 10/28/13 [phone]

## 2013-10-28 NOTE — Discharge Summary (Signed)
Physician Discharge Summary  Patient ID: Jim Shields MRN: [id number] DOB/AGE: [birth year] 79 y.o.  Admit date: 10/27/2013 Discharge date: 10/28/2013  Primary Care Physician: Dr. Star Age at [facility]  Discharge Diagnoses:    . Hypokalemia most likely secondary to HCTZ  . HTN (hypertension) . Diabetes mellitus, type 2 . COPD (chronic obstructive pulmonary disease)  Consults: None   Recommendations for Outpatient Follow-up:  HCTZ discontinued, patient recommended to have another BMET checked on 10/31/13  Allergies:  No Known Allergies   Discharge Medications:   Medication List    STOP taking these medications       hydrochlorothiazide 25 MG tablet  Commonly known as:  HYDRODIURIL      TAKE these medications       amLODipine 5 MG tablet  Commonly known as:  NORVASC  Take 5 mg by mouth daily.     aspirin 81 MG chewable tablet  Chew 81 mg by mouth daily.     buPROPion 150 MG 24 hr tablet  Commonly known as:  WELLBUTRIN XL  Take 150 mg by mouth daily.     insulin glargine 100 UNIT/ML injection  Commonly known as:  LANTUS  Inject 20 Units into the skin every morning.     latanoprost 0.005 % ophthalmic solution  Commonly known as:  XALATAN  Place 1 drop into both eyes at bedtime.     lisinopril 40 MG tablet  Commonly known as:  PRINIVIL,ZESTRIL  Take 40 mg by mouth daily.     magnesium oxide 400 (241.3 MG) MG tablet  Commonly known as:  MAG-OX  Take 1 tablet (400 mg total) by mouth 2 (two) times daily. X 1 week     meloxicam 7.5 MG tablet  Commonly known as:  MOBIC  Take 7.5 mg by mouth daily.     metFORMIN 1000 MG tablet  Commonly known as:  GLUCOPHAGE  Take 1,000 mg by mouth 2 (two) times daily with a meal.     metoprolol succinate 25 MG 24 hr tablet  Commonly known as:  TOPROL-XL  Take 25 mg by mouth daily.     multivitamin with minerals Tabs tablet  Take 1 tablet by mouth daily.         Brief H and P: For complete details  please refer to admission H and P, but in brief Jim Shields is a 79 y.o. male with Past medical history of hypertension, diabetes mellitus, dyslipidemia, COPD. patient presented with muscle cramps, he had routine labs done at PCPs office and was found to be severely hypokalemic and was recommended to go to the ER.  The patient denied any complaints of any fever, chills, headache, cough, chest pain, palpitation, shortness of breath, orthopnea, PND, nausea, vomiting, abdominal pain, diarrhea, constipation, active bleeding, burning urination, dizziness, pedal edema, focal neurological deficit.  He mentioned that  he is compliant with his medication has been taking hydrochlorothiazide regularly. There is no change in his medications recently    Hospital Course:     Hypokalemia: Resolved, potassium was 2.6 at the time of admission. Magnesium 1.8. HCTZ was discontinued and patient was aggressively given potassium IV and oral. Magnesium was replaced IV, at the time of DC potassium was 3.7. His blood pressure remained fairly stable during the hospitalization hence hydrochlorothiazide was completely discontinued. This was explained well to the patient and his wife. He will have repeat labs checked at his PCPs office on 10/31/2013 in 3 days. Patient was walking around in the room  without any difficulty, had no cramps     HTN (hypertension) remained stable, DC'd HCTZ     Diabetes mellitus, type 2somewhat uncontrolled, follow up with PCP      COPD (chronic obstructive pulmonary disease): Stable, no wheezing     Day of Discharge BP 131/74  Pulse 59  Temp(Src) 98.3 F (36.8 C) (Oral)  Resp 20  Ht 5\' 9"  (1.753 m)  Wt 81.557 kg (179 lb 12.8 oz)  BMI 26.54 kg/m2  SpO2 97%  Physical Exam: General: Alert and awake oriented x3 not in any acute distress. HEENT: anicteric sclera, pupils reactive to light and accommodation CVS: S1-S2 clear no murmur rubs or gallops Chest: clear to auscultation  bilaterally, no wheezing rales or rhonchi Abdomen: soft nontender, nondistended, normal bowel sounds Extremities: no cyanosis, clubbing or edema noted bilaterally Neuro: Cranial nerves II-XII intact, no focal neurological deficits   The results of significant diagnostics from this hospitalization (including imaging, microbiology, ancillary and laboratory) are listed below for reference.    LAB RESULTS: Basic Metabolic Panel:  Recent Labs Lab 10/28/13 0110 10/28/13 0800 10/28/13 1255  NA 137 138  --   K 2.8* 3.1* 3.7  CL 97 100  --   CO2 29 29  --   GLUCOSE 272* 221*  --   BUN 14 11  --   CREATININE 1.14 1.10  --   CALCIUM 8.5 8.8  --   MG 1.8  --   --    Liver Function Tests:  Recent Labs Lab 10/27/13 1848 10/28/13 0800  AST 22 15  ALT 19 16  ALKPHOS 72 66  BILITOT 0.3 0.4  PROT 6.8 6.5  ALBUMIN 3.6 3.4*   No results found for this basename: LIPASE, AMYLASE,  in the last 168 hours No results found for this basename: AMMONIA,  in the last 168 hours CBC:  Recent Labs Lab 10/27/13 1848 10/28/13 0800  WBC 5.3 4.4  NEUTROABS 2.8 2.4  HGB 14.8 14.0  HCT 42.0 39.7  MCV 85.4 84.6  PLT 149* 127*   Cardiac Enzymes: No results found for this basename: CKTOTAL, CKMB, CKMBINDEX, TROPONINI,  in the last 168 hours BNP: No components found with this basename: POCBNP,  CBG:  Recent Labs Lab 10/28/13 0636 10/28/13 1112  GLUCAP 192* 196*    Significant Diagnostic Studies:  No results found.  Home    Disposition and Follow-up:     Discharge Orders   Future Orders Complete By Expires   Diet Carb Modified  As directed    Increase activity slowly  As directed        DISPOSITION:  home  DIET:  Carb modify diet  TESTS THAT NEED FOLLOW-UP  BMET in 3 days   DISCHARGE FOLLOW-UP Follow-up Information   Follow up with Dr Acey Lav  . Schedule an appointment as soon as possible for a visit in 1 week. (These make an appointment on Monday to get the  BMET checked for potassium level )       Time spent on Discharge:  35 minutes   Signed:   Abdoulaye Drum M.D. [facility] 10/28/2013, 1:45 PM Pager: [phone]

## 2013-10-28 NOTE — Progress Notes (Signed)
DC IV and tele per protocol and MD orders; discharge paper work reviewed and signed with patient; prescriptions for mag-ox given to patient; patient asked to follow up with Dr. Chrys Racer and schedule an appointment on Monday to have BMET checked for K level; patient advised to stop taking hydrochlorothiazide 25 mg; no further questions from patient and significant other.  BARNETT, Geroge Baseman

## 2014-04-30 ENCOUNTER — Emergency Department (HOSPITAL_COMMUNITY)
Admission: EM | Admit: 2014-04-30 | Discharge: 2014-05-01 | Disposition: A | Discharge status: Home / Self Care | Payer: Medicare Other | Attending: Emergency Medicine | Admitting: Emergency Medicine

## 2014-04-30 ENCOUNTER — Encounter (HOSPITAL_COMMUNITY): Payer: Self-pay | Admitting: Emergency Medicine

## 2014-04-30 DIAGNOSIS — Y929 Unspecified place or not applicable: Secondary | ICD-10-CM | POA: Insufficient documentation

## 2014-04-30 DIAGNOSIS — T161XXA Foreign body in right ear, initial encounter: Secondary | ICD-10-CM

## 2014-04-30 DIAGNOSIS — E119 Type 2 diabetes mellitus without complications: Secondary | ICD-10-CM | POA: Insufficient documentation

## 2014-04-30 DIAGNOSIS — Z79899 Other long term (current) drug therapy: Secondary | ICD-10-CM | POA: Insufficient documentation

## 2014-04-30 DIAGNOSIS — Z794 Long term (current) use of insulin: Secondary | ICD-10-CM | POA: Insufficient documentation

## 2014-04-30 DIAGNOSIS — Z87891 Personal history of nicotine dependence: Secondary | ICD-10-CM | POA: Insufficient documentation

## 2014-04-30 DIAGNOSIS — IMO0002 Reserved for concepts with insufficient information to code with codable children: Secondary | ICD-10-CM | POA: Insufficient documentation

## 2014-04-30 DIAGNOSIS — T169XXA Foreign body in ear, unspecified ear, initial encounter: Secondary | ICD-10-CM | POA: Insufficient documentation

## 2014-04-30 DIAGNOSIS — J449 Chronic obstructive pulmonary disease, unspecified: Secondary | ICD-10-CM | POA: Insufficient documentation

## 2014-04-30 DIAGNOSIS — I252 Old myocardial infarction: Secondary | ICD-10-CM | POA: Insufficient documentation

## 2014-04-30 DIAGNOSIS — J4489 Other specified chronic obstructive pulmonary disease: Secondary | ICD-10-CM | POA: Insufficient documentation

## 2014-04-30 DIAGNOSIS — I10 Essential (primary) hypertension: Secondary | ICD-10-CM | POA: Insufficient documentation

## 2014-04-30 DIAGNOSIS — Z7982 Long term (current) use of aspirin: Secondary | ICD-10-CM | POA: Insufficient documentation

## 2014-04-30 DIAGNOSIS — Z791 Long term (current) use of non-steroidal anti-inflammatories (NSAID): Secondary | ICD-10-CM | POA: Insufficient documentation

## 2014-04-30 DIAGNOSIS — Y939 Activity, unspecified: Secondary | ICD-10-CM | POA: Insufficient documentation

## 2014-04-30 LAB — BASIC METABOLIC PANEL
BUN: 6 mg/dL (ref 6–23)
CHLORIDE: 98 meq/L (ref 96–112)
CO2: 29 mEq/L (ref 19–32)
CREATININE: 0.99 mg/dL (ref 0.50–1.35)
Calcium: 9.4 mg/dL (ref 8.4–10.5)
GFR calc non Af Amer: 75 mL/min — ABNORMAL LOW (ref >90)
GFR, EST AFRICAN AMERICAN: 87 mL/min — AB (ref >90)
GLUCOSE: 215 mg/dL — AB (ref 70–99)
Potassium: 3 mEq/L — ABNORMAL LOW (ref 3.7–5.3)
Sodium: 140 mEq/L (ref 137–147)

## 2014-04-30 LAB — CBG MONITORING, ED: GLUCOSE-CAPILLARY: 195 mg/dL — AB (ref 70–99)

## 2014-04-30 LAB — TROPONIN I: Troponin I: <0.3 ng/mL (ref <0.30)

## 2014-04-30 MED ORDER — POTASSIUM CHLORIDE CRYS ER 20 MEQ PO TBCR
40.0000 meq | EXTENDED_RELEASE_TABLET | Freq: Once | ORAL | Status: AC
  Administered 2014-04-30: 40 meq via ORAL
  Filled 2014-04-30: qty 2

## 2014-04-30 NOTE — ED Notes (Signed)
Pt reports he has not been taking his medications because he does not like the way they make him feel.

## 2014-04-30 NOTE — ED Notes (Signed)
CBG 195 

## 2014-04-30 NOTE — Discharge Instructions (Signed)
Ear Foreign Body An ear foreign body is an object that is stuck in the ear. It is common for young children to put objects into the ear canal. These may include pebbles, beads, beans, and any other small objects which will fit. In adults, objects such as cotton swabs may become lodged in the ear canal. In all ages, the most common foreign bodies are insects that enter the ear canal.  SYMPTOMS  Foreign bodies may cause pain, buzzing or roaring sounds, hearing loss, and ear drainage.  HOME CARE INSTRUCTIONS   Keep all follow-up appointments with your caregiver as told.  Keep small objects out of reach of young children. Tell them not to put anything in their ears. SEEK IMMEDIATE MEDICAL CARE IF:   You have bleeding from the ear.  You have increased pain or swelling of the ear.  You have reduced hearing.  You have discharge coming from the ear.  You have a fever.  You have a headache. MAKE SURE YOU:   Understand these instructions.  Will watch your condition.  Will get help right away if you are not doing well or get worse. Document Released: 10/25/2000 Document Revised: 01/20/2012 Document Reviewed: 06/15/2008 [facility]ExitCare Patient Information 2015 [employer], [place]. This information is not intended to replace advice given to you by your health care provider. Make sure you discuss any questions you have with your health care provider.  Hypertension Hypertension, commonly called high blood pressure, is when the force of blood pumping through your arteries is too strong. Your arteries are the blood vessels that carry blood from your heart throughout your body. A blood pressure reading consists of a higher number over a lower number, such as 110/72. The higher number (systolic) is the pressure inside your arteries when your heart pumps. The lower number (diastolic) is the pressure inside your arteries when your heart relaxes. Ideally you want your blood pressure below 120/80. Hypertension  forces your heart to work harder to pump blood. Your arteries may become narrow or stiff. Having hypertension puts you at risk for heart disease, stroke, and other problems.  RISK FACTORS Some risk factors for high blood pressure are controllable. Others are not.  Risk factors you cannot control include:   Race. You may be at higher risk if you are African American.  Age. Risk increases with age.  Gender. Men are at higher risk than women before age 78 years. After age 78, women are at higher risk than men. Risk factors you can control include:  Not getting enough exercise or physical activity.  Being overweight.  Getting too much fat, sugar, calories, or salt in your diet.  Drinking too much alcohol. SIGNS AND SYMPTOMS Hypertension does not usually cause signs or symptoms. Extremely high blood pressure (hypertensive crisis) may cause headache, anxiety, shortness of breath, and nosebleed. DIAGNOSIS  To check if you have hypertension, your health care provider will measure your blood pressure while you are seated, with your arm held at the level of your heart. It should be measured at least twice using the same arm. Certain conditions can cause a difference in blood pressure between your right and left arms. A blood pressure reading that is higher than normal on one occasion does not mean that you need treatment. If one blood pressure reading is high, ask your health care provider about having it checked again. TREATMENT  Treating high blood pressure includes making lifestyle changes and possibly taking medication. Living a healthy lifestyle can help lower high  blood pressure. You may need to change some of your habits. Lifestyle changes may include:  Following the DASH diet. This diet is high in fruits, vegetables, and whole grains. It is low in salt, red meat, and added sugars.  Getting at least 2 1/2 hours of brisk physical activity every week.  Losing weight if necessary.  Not  smoking.  Limiting alcoholic beverages.  Learning ways to reduce stress. If lifestyle changes are not enough to get your blood pressure under control, your health care provider may prescribe medicine. You may need to take more than one. Work closely with your health care provider to understand the risks and benefits. HOME CARE INSTRUCTIONS  Have your blood pressure rechecked as directed by your health care provider.   Only take medicine as directed by your health care provider. Follow the directions carefully. Blood pressure medicines must be taken as prescribed. The medicine does not work as well when you skip doses. Skipping doses also puts you at risk for problems.   Do not smoke.   Monitor your blood pressure at home as directed by your health care provider. SEEK MEDICAL CARE IF:   You think you are having a reaction to medicines taken.  You have recurrent headaches or feel dizzy.  You have swelling in your ankles.  You have trouble with your vision. SEEK IMMEDIATE MEDICAL CARE IF:  You develop a severe headache or confusion.  You have unusual weakness, numbness, or feel faint.  You have severe chest or abdominal pain.  You vomit repeatedly.  You have trouble breathing. MAKE SURE YOU:   Understand these instructions.  Will watch your condition.  Will get help right away if you are not doing well or get worse. Document Released: 10/28/2005 Document Revised: 11/02/2013 Document Reviewed: 08/20/2013 [facility] HospitalExitCare Patient Information 2015 [place], [place]. This information is not intended to replace advice given to you by your health care provider. Make sure you discuss any questions you have with your health care provider.

## 2014-04-30 NOTE — ED Notes (Signed)
The pts pulse is so irregular that the bp  Machines are not able to pick it up.  Manual bp

## 2014-04-30 NOTE — ED Notes (Signed)
The pt has an  A fb in his rt ear.  It is an ear piece from ear buds

## 2014-04-30 NOTE — ED Notes (Signed)
Pt and his daughter state pt has not taken any prescribed medications for at least 3 weeks per pt "I dont like the way they make me feel. I'm not taking them" daughter confirms pt is non compliant pt has been prescibed norvasc and lisinopril for HTN per daughter

## 2014-04-30 NOTE — ED Provider Notes (Signed)
CSN: [id number]     Arrival date & time 04/30/14  2139 History   First MD Initiated Contact with Patient 04/30/14 2216     Chief Complaint  Patient presents with  . Ear Problem     (Consider location/radiation/quality/duration/timing/severity/associated sxs/prior Treatment) HPI Comments: Patient presents with foreign body in his right ear for the past several hours. It is a piece of his head phones. His neighbors try to remove it with forceps with no success. He wears hearing aids at baseline. He endorses some decreased hearing. Denies any dizziness, headache, chest pain, shortness of breath. No fevers or vomiting. He is not on any anticoagulants. He is hypertensive and has not taken his medication in several weeks. He denies any headache, dizziness, vision change, chest pain or shortness of breath. EKG was obtained in triage for an irregular heart rhythm  The history is provided by the patient.    Past Medical History  Diagnosis Date  . COPD (chronic obstructive pulmonary disease)   . Hypertension   . Diabetes mellitus   . MI (myocardial infarction)   . Hyperlipidemia 01/13/2012  . HTN (hypertension) 01/13/2012  . Diabetes mellitus, type 2 01/13/2012   Past Surgical History  Procedure Laterality Date  . Cholecystectomy     No family history on file. History  Substance Use Topics  . Smoking status: Former Games developermoker  . Smokeless tobacco: Not on file  . Alcohol Use: No    Review of Systems  Constitutional: Negative for fever, activity change and appetite change.  Eyes: Negative for visual disturbance.  Respiratory: Negative for chest tightness and shortness of breath.   Genitourinary: Negative for dysuria and hematuria.  Neurological: Negative for dizziness and headaches.  A complete 10 system review of systems was obtained and all systems are negative except as noted in the HPI and PMH.      Allergies  Review of patient's allergies indicates no known allergies.  Home  Medications   Prior to Admission medications   Medication Sig Start Date End Date Taking? Authorizing Stephnie Parlier  amLODipine (NORVASC) 5 MG tablet Take 5 mg by mouth daily.    Historical Shantil Vallejo, MD  aspirin 81 MG chewable tablet Chew 81 mg by mouth daily.    Historical Adanya Sosinski, MD  buPROPion (WELLBUTRIN XL) 150 MG 24 hr tablet Take 150 mg by mouth daily.    Historical Braylee Bosher, MD  insulin glargine (LANTUS) 100 UNIT/ML injection Inject 20 Units into the skin every morning.     Historical Demoni Parmar, MD  latanoprost (XALATAN) 0.005 % ophthalmic solution Place 1 drop into both eyes at bedtime.    Historical Orlene Salmons, MD  lisinopril (PRINIVIL,ZESTRIL) 40 MG tablet Take 40 mg by mouth daily.    Historical Roshon Duell, MD  magnesium oxide (MAG-OX) 400 (241.3 MG) MG tablet Take 1 tablet (400 mg total) by mouth 2 (two) times daily. X 1 week 10/28/13   Ripudeep Jenna LuoK Rai, MD  meloxicam (MOBIC) 7.5 MG tablet Take 7.5 mg by mouth daily.    Historical  Shellhammer, MD  metFORMIN (GLUCOPHAGE) 1000 MG tablet Take 1,000 mg by mouth 2 (two) times daily with a meal.    Historical Nimra Puccinelli, MD  metoprolol succinate (TOPROL-XL) 25 MG 24 hr tablet Take 25 mg by mouth daily.    Historical Caelen Reierson, MD  Multiple Vitamin (MULITIVITAMIN WITH MINERALS) TABS Take 1 tablet by mouth daily.    Historical Joylene Wescott, MD   BP 181/98  Pulse 55  Temp(Src) 97.7 F (36.5 C)  Resp 22  Ht 5\' 9"  (1.753 m)  Wt 160 lb (72.576 kg)  BMI 23.62 kg/m2  SpO2 98% Physical Exam  Nursing note and vitals reviewed. Constitutional: He is oriented to person, place, and time. He appears well-developed and well-nourished. No distress.  HENT:  Head: Normocephalic and atraumatic.  Left Ear: External ear normal.  Mouth/Throat: Oropharynx is clear and moist. No oropharyngeal exudate.  Pink-colored foreign body in right ear canal, no bleeding  Eyes: Conjunctivae and EOM are normal. Pupils are equal, round, and reactive to light.  Neck: Normal range of  motion. Neck supple.  No meningismus.  Cardiovascular: Normal rate, normal heart sounds and intact distal pulses.   No murmur heard. Irregular rhythm  Pulmonary/Chest: Effort normal and breath sounds normal. No respiratory distress.  Abdominal: Soft. There is no tenderness. There is no rebound and no guarding.  Musculoskeletal: Normal range of motion. He exhibits no edema and no tenderness.  Neurological: He is alert and oriented to person, place, and time. No cranial nerve deficit. He exhibits normal muscle tone. Coordination normal.  No ataxia on finger to nose bilaterally. No pronator drift. 5/5 strength throughout. CN 2-12 intact. Negative Romberg. Equal grip strength. Sensation intact. Gait is normal.   Skin: Skin is warm.  Psychiatric: He has a normal mood and affect. His behavior is normal.    ED Course  FOREIGN BODY REMOVAL Date/Time: 04/30/2014 11:12 PM Performed by: Glynn Octave Authorized by: Glynn Octave Consent: Verbal consent obtained. Risks and benefits: risks, benefits and alternatives were discussed Consent given by: patient Patient understanding: patient states understanding of the procedure being performed Patient consent: the patient's understanding of the procedure matches consent given Procedure consent: procedure consent matches procedure scheduled Relevant documents: relevant documents present and verified Test results: test results available and properly labeled Site marked: the operative site was marked Imaging studies: imaging studies available Patient identity confirmed: verbally with patient and provided demographic data Time out: Immediately prior to procedure a "time out" was called to verify the correct patient, procedure, equipment, support staff and site/side marked as required. Body area: ear Location details: right ear Patient sedated: no Patient restrained: no Patient cooperative: yes Localization method: visualized and probed Removal  mechanism: alligator forceps Complexity: simple 1 objects recovered. Objects recovered: pink ear bud Post-procedure assessment: foreign body removed Patient tolerance: Patient tolerated the procedure well with no immediate complications.   (including critical care time) Labs Review Labs Reviewed  CBC WITH DIFFERENTIAL - Abnormal; Notable for the following:    WBC 3.3 (*)    Platelets 114 (*)    All other components within normal limits  BASIC METABOLIC PANEL - Abnormal; Notable for the following:    Potassium 3.0 (*)    Glucose, Bld 215 (*)    GFR calc non Af Amer 75 (*)    GFR calc Af Amer 87 (*)    All other components within normal limits  CBG MONITORING, ED - Abnormal; Notable for the following:    Glucose-Capillary 195 (*)    All other components within normal limits  TROPONIN I    Imaging Review No results found.   EKG Interpretation   Date/Time:  Saturday April 30 2014 22:30:12 EDT Ventricular Rate:  69 PR Interval:  150 QRS Duration: 89 QT Interval:  425 QTC Calculation: 455 R Axis:   83 Text Interpretation:  Sinus arrhythmia Probable left atrial enlargement  Borderline right axis deviation LVH with secondary repolarization  abnormality ST depr, consider ischemia, inferior leads No significant  change was found Confirmed by Manus GunningANCOUR  MD, STEPHEN [phone]([id number]) on 04/30/2014  11:13:26 PM      MDM   Final diagnoses:  Foreign body in right ear, initial encounter  Essential hypertension   Foreign body to right ear. No complaints. Noncompliant with medications. EKG obtained in triage for irregular rhythm. Sinus arrhythmia seen with unchanged ST segments laterally. No chest pain, SOB, headache, vision change.   Foreign body removed as above. Subsequent exam shows no tympanic membrane perforation.  Labs show hypokalemia of 3.0. This was replaced. Patient instructed to take his blood pressure medications as prescribed. No evidence of endorgan damage. Creatinine normal.  Hyperglycemia without DKA.  Follow up with PCP. Return precautions discussed.    Glynn OctaveStephen Rancour, MD 05/01/14 [phone]

## 2014-05-01 LAB — CBC WITH DIFFERENTIAL/PLATELET
BASOS PCT: 0 % (ref 0–1)
Basophils Absolute: 0 10*3/uL (ref 0.0–0.1)
Eosinophils Absolute: 0.1 10*3/uL (ref 0.0–0.7)
Eosinophils Relative: 2 % (ref 0–5)
HCT: 43.4 % (ref 39.0–52.0)
HEMOGLOBIN: 15.4 g/dL (ref 13.0–17.0)
Lymphocytes Relative: 25 % (ref 12–46)
Lymphs Abs: 0.8 10*3/uL (ref 0.7–4.0)
MCH: 29.8 pg (ref 26.0–34.0)
MCHC: 35.5 g/dL (ref 30.0–36.0)
MCV: 84.1 fL (ref 78.0–100.0)
MONO ABS: 0.3 10*3/uL (ref 0.1–1.0)
MONOS PCT: 9 % (ref 3–12)
Neutro Abs: 2.1 10*3/uL (ref 1.7–7.7)
Neutrophils Relative %: 64 % (ref 43–77)
Platelets: 114 10*3/uL — ABNORMAL LOW (ref 150–400)
RBC: 5.16 MIL/uL (ref 4.22–5.81)
RDW: 12.9 % (ref 11.5–15.5)
WBC: 3.3 10*3/uL — ABNORMAL LOW (ref 4.0–10.5)

## 2014-09-05 ENCOUNTER — Inpatient Hospital Stay (HOSPITAL_COMMUNITY)
Admission: EM | Admit: 2014-09-05 | Discharge: 2014-09-16 | DRG: 335 | Disposition: A | Discharge status: Skilled Nursing Facility | Payer: Medicare Other | Attending: Internal Medicine | Admitting: Internal Medicine

## 2014-09-05 ENCOUNTER — Emergency Department (HOSPITAL_COMMUNITY): Payer: Medicare Other

## 2014-09-05 ENCOUNTER — Encounter (HOSPITAL_COMMUNITY): Payer: Self-pay | Admitting: Emergency Medicine

## 2014-09-05 DIAGNOSIS — E860 Dehydration: Secondary | ICD-10-CM | POA: Diagnosis present

## 2014-09-05 DIAGNOSIS — Z7982 Long term (current) use of aspirin: Secondary | ICD-10-CM | POA: Diagnosis not present

## 2014-09-05 DIAGNOSIS — Z87891 Personal history of nicotine dependence: Secondary | ICD-10-CM

## 2014-09-05 DIAGNOSIS — I4729 Other ventricular tachycardia: Secondary | ICD-10-CM | POA: Insufficient documentation

## 2014-09-05 DIAGNOSIS — I252 Old myocardial infarction: Secondary | ICD-10-CM

## 2014-09-05 DIAGNOSIS — E876 Hypokalemia: Secondary | ICD-10-CM | POA: Diagnosis not present

## 2014-09-05 DIAGNOSIS — E119 Type 2 diabetes mellitus without complications: Secondary | ICD-10-CM

## 2014-09-05 DIAGNOSIS — K565 Intestinal adhesions [bands], unspecified as to partial versus complete obstruction: Secondary | ICD-10-CM

## 2014-09-05 DIAGNOSIS — I10 Essential (primary) hypertension: Secondary | ICD-10-CM | POA: Diagnosis present

## 2014-09-05 DIAGNOSIS — R112 Nausea with vomiting, unspecified: Secondary | ICD-10-CM | POA: Diagnosis present

## 2014-09-05 DIAGNOSIS — R079 Chest pain, unspecified: Secondary | ICD-10-CM

## 2014-09-05 DIAGNOSIS — I429 Cardiomyopathy, unspecified: Secondary | ICD-10-CM | POA: Diagnosis present

## 2014-09-05 DIAGNOSIS — H9190 Unspecified hearing loss, unspecified ear: Secondary | ICD-10-CM | POA: Diagnosis present

## 2014-09-05 DIAGNOSIS — E870 Hyperosmolality and hypernatremia: Secondary | ICD-10-CM | POA: Diagnosis present

## 2014-09-05 DIAGNOSIS — J449 Chronic obstructive pulmonary disease, unspecified: Secondary | ICD-10-CM | POA: Diagnosis present

## 2014-09-05 DIAGNOSIS — N179 Acute kidney failure, unspecified: Secondary | ICD-10-CM | POA: Diagnosis present

## 2014-09-05 DIAGNOSIS — K913 Postprocedural intestinal obstruction: Secondary | ICD-10-CM | POA: Diagnosis not present

## 2014-09-05 DIAGNOSIS — I472 Ventricular tachycardia: Secondary | ICD-10-CM | POA: Diagnosis not present

## 2014-09-05 DIAGNOSIS — Y839 Surgical procedure, unspecified as the cause of abnormal reaction of the patient, or of later complication, without mention of misadventure at the time of the procedure: Secondary | ICD-10-CM | POA: Diagnosis not present

## 2014-09-05 DIAGNOSIS — R188 Other ascites: Secondary | ICD-10-CM | POA: Diagnosis present

## 2014-09-05 DIAGNOSIS — R109 Unspecified abdominal pain: Secondary | ICD-10-CM

## 2014-09-05 DIAGNOSIS — J690 Pneumonitis due to inhalation of food and vomit: Secondary | ICD-10-CM | POA: Diagnosis not present

## 2014-09-05 DIAGNOSIS — E785 Hyperlipidemia, unspecified: Secondary | ICD-10-CM | POA: Diagnosis present

## 2014-09-05 DIAGNOSIS — J189 Pneumonia, unspecified organism: Secondary | ICD-10-CM | POA: Insufficient documentation

## 2014-09-05 DIAGNOSIS — Z794 Long term (current) use of insulin: Secondary | ICD-10-CM | POA: Diagnosis not present

## 2014-09-05 DIAGNOSIS — R58 Hemorrhage, not elsewhere classified: Secondary | ICD-10-CM

## 2014-09-05 DIAGNOSIS — K56609 Unspecified intestinal obstruction, unspecified as to partial versus complete obstruction: Secondary | ICD-10-CM | POA: Diagnosis present

## 2014-09-05 DIAGNOSIS — R509 Fever, unspecified: Secondary | ICD-10-CM

## 2014-09-05 LAB — HEPATIC FUNCTION PANEL
ALT: 11 U/L (ref 0–53)
AST: 18 U/L (ref 0–37)
Albumin: 3.9 g/dL (ref 3.5–5.2)
Alkaline Phosphatase: 105 U/L (ref 39–117)
BILIRUBIN TOTAL: 0.7 mg/dL (ref 0.3–1.2)
Total Protein: 7.7 g/dL (ref 6.0–8.3)

## 2014-09-05 LAB — BASIC METABOLIC PANEL
Anion gap: 12 (ref 5–15)
BUN: 8 mg/dL (ref 6–23)
CALCIUM: 9.5 mg/dL (ref 8.4–10.5)
CO2: 30 mEq/L (ref 19–32)
Chloride: 98 mEq/L (ref 96–112)
Creatinine, Ser: 1.04 mg/dL (ref 0.50–1.35)
GFR calc Af Amer: 76 mL/min — ABNORMAL LOW (ref >90)
GFR calc non Af Amer: 66 mL/min — ABNORMAL LOW (ref >90)
GLUCOSE: 214 mg/dL — AB (ref 70–99)
Potassium: 3.1 mEq/L — ABNORMAL LOW (ref 3.7–5.3)
Sodium: 140 mEq/L (ref 137–147)

## 2014-09-05 LAB — LIPASE, BLOOD: Lipase: 31 U/L (ref 11–59)

## 2014-09-05 LAB — I-STAT TROPONIN, ED: Troponin i, poc: 0.02 ng/mL (ref 0.00–0.08)

## 2014-09-05 LAB — CBC
HEMATOCRIT: 44.5 % (ref 39.0–52.0)
HEMOGLOBIN: 15.7 g/dL (ref 13.0–17.0)
MCH: 29.7 pg (ref 26.0–34.0)
MCHC: 35.3 g/dL (ref 30.0–36.0)
MCV: 84.3 fL (ref 78.0–100.0)
Platelets: 152 10*3/uL (ref 150–400)
RBC: 5.28 MIL/uL (ref 4.22–5.81)
RDW: 13.3 % (ref 11.5–15.5)
WBC: 5.9 10*3/uL (ref 4.0–10.5)

## 2014-09-05 MED ORDER — IOHEXOL 350 MG/ML SOLN
100.0000 mL | Freq: Once | INTRAVENOUS | Status: AC | PRN
  Administered 2014-09-05: 100 mL via INTRAVENOUS

## 2014-09-05 MED ORDER — ONDANSETRON 4 MG PO TBDP
8.0000 mg | ORAL_TABLET | Freq: Once | ORAL | Status: AC
  Administered 2014-09-05: 8 mg via ORAL
  Filled 2014-09-05: qty 2

## 2014-09-05 MED ORDER — GI COCKTAIL ~~LOC~~
30.0000 mL | Freq: Once | ORAL | Status: AC
  Administered 2014-09-05: 30 mL via ORAL
  Filled 2014-09-05: qty 30

## 2014-09-05 MED ORDER — LABETALOL HCL 5 MG/ML IV SOLN
10.0000 mg | Freq: Once | INTRAVENOUS | Status: AC
  Administered 2014-09-05: 10 mg via INTRAVENOUS
  Filled 2014-09-05: qty 4

## 2014-09-05 MED ORDER — HYDROMORPHONE HCL 1 MG/ML IJ SOLN
0.5000 mg | Freq: Once | INTRAMUSCULAR | Status: AC
  Administered 2014-09-05: 0.5 mg via INTRAVENOUS
  Filled 2014-09-05: qty 1

## 2014-09-05 MED ORDER — LABETALOL HCL 5 MG/ML IV SOLN
10.0000 mg | Freq: Once | INTRAVENOUS | Status: AC
  Administered 2014-09-05: 10 mg via INTRAVENOUS

## 2014-09-05 NOTE — ED Notes (Signed)
Pt in c/o chest pain and abd pain with n/v for the last few days, pt denies pain when asked at this time but states he is sore, alert and oriented, no distress noted

## 2014-09-05 NOTE — ED Notes (Signed)
MD at bedside. 

## 2014-09-05 NOTE — ED Notes (Signed)
Pt vomiting in waiting area.  Denies increased chest pain.

## 2014-09-05 NOTE — Consult Note (Signed)
Reason for Consult: SBO Referring Physician: Zacarias Pontes, PA  Jim Shields is an 80 y.o. male.  HPI:  Pt is an 80 yo M who presents with 3 weeks of chest pain, and 2 days of n/v/abdominal pain.  Pt describes abdominal pain as burning and aching.  He has had emesis for 2 days.  He has not had any jaundice, fever or chills.  He denies SOB or syncope.  No palpitations.  Had a BM 2 days ago, but does not recall when he last had flatus.  He has had hernia surgery and gallbladder surgery.  He does not recall other symptoms like this.    Past Medical History  Diagnosis Date  . COPD (chronic obstructive pulmonary disease)   . Hypertension   . Diabetes mellitus   . MI (myocardial infarction)   . Hyperlipidemia 01/13/2012  . HTN (hypertension) 01/13/2012  . Diabetes mellitus, type 2 01/13/2012    Past Surgical History  Procedure Laterality Date  . Cholecystectomy      History reviewed. No pertinent family history.  Social History:  reports that he has quit smoking. He does not have any smokeless tobacco history on file. He reports that he does not drink alcohol or use illicit drugs.  Allergies: No Known Allergies  Medications:  Prior to Admission:  Prescriptions prior to admission  Medication Sig Dispense Refill  . acetaminophen (TYLENOL) 325 MG tablet Take 650 mg by mouth every 6 (six) hours as needed.      Marland Kitchen albuterol (PROVENTIL) (2.5 MG/3ML) 0.083% nebulizer solution Take 2.5 mg by nebulization every 6 (six) hours as needed for wheezing or shortness of breath.      Marland Kitchen amLODipine (NORVASC) 5 MG tablet Take 5 mg by mouth daily.      Marland Kitchen aspirin 81 MG chewable tablet Chew 81 mg by mouth daily.      Marland Kitchen atorvastatin (LIPITOR) 40 MG tablet Take 40 mg by mouth every evening.      Marland Kitchen buPROPion (WELLBUTRIN XL) 150 MG 24 hr tablet Take 150 mg by mouth daily.      . Fluticasone-Salmeterol (ADVAIR) 250-50 MCG/DOSE AEPB Inhale 1 puff into the lungs 2 (two) times daily.      .  hydrochlorothiazide (HYDRODIURIL) 25 MG tablet Take 25 mg by mouth daily.      . insulin glargine (LANTUS) 100 UNIT/ML injection Inject 20 Units into the skin every morning.       . latanoprost (XALATAN) 0.005 % ophthalmic solution Place 1 drop into both eyes at bedtime.      Marland Kitchen lisinopril (PRINIVIL,ZESTRIL) 40 MG tablet Take 40 mg by mouth daily.      . meloxicam (MOBIC) 7.5 MG tablet Take 7.5 mg by mouth daily.      . metFORMIN (GLUCOPHAGE) 1000 MG tablet Take 1,000 mg by mouth 2 (two) times daily with a meal.      . metoprolol succinate (TOPROL-XL) 25 MG 24 hr tablet Take 25 mg by mouth daily.      . Multiple Vitamin (MULITIVITAMIN WITH MINERALS) TABS Take 1 tablet by mouth daily.      Marland Kitchen tiotropium (SPIRIVA) 18 MCG inhalation capsule Place 18 mcg into inhaler and inhale daily.        Results for orders placed during the hospital encounter of 09/05/14 (from the past 48 hour(s))  CBC     Status: None   Collection Time    09/05/14  4:50 PM      Result Value Ref Range  WBC 5.9  4.0 - 10.5 K/uL   RBC 5.28  4.22 - 5.81 MIL/uL   Hemoglobin 15.7  13.0 - 17.0 g/dL   HCT 44.5  39.0 - 52.0 %   MCV 84.3  78.0 - 100.0 fL   MCH 29.7  26.0 - 34.0 pg   MCHC 35.3  30.0 - 36.0 g/dL   RDW 13.3  11.5 - 15.5 %   Platelets 152  150 - 400 K/uL  BASIC METABOLIC PANEL     Status: Abnormal   Collection Time    09/05/14  4:50 PM      Result Value Ref Range   Sodium 140  137 - 147 mEq/L   Potassium 3.1 (*) 3.7 - 5.3 mEq/L   Chloride 98  96 - 112 mEq/L   CO2 30  19 - 32 mEq/L   Glucose, Bld 214 (*) 70 - 99 mg/dL   BUN 8  6 - 23 mg/dL   Creatinine, Ser 1.04  0.50 - 1.35 mg/dL   Calcium 9.5  8.4 - 10.5 mg/dL   GFR calc non Af Amer 66 (*) >90 mL/min   GFR calc Af Amer 76 (*) >90 mL/min   Comment: (NOTE)     The eGFR has been calculated using the CKD EPI equation.     This calculation has not been validated in all clinical situations.     eGFR's persistently <90 mL/min signify possible Chronic Kidney      Disease.   Anion gap 12  5 - 15  I-STAT TROPOININ, ED     Status: None   Collection Time    09/05/14  5:19 PM      Result Value Ref Range   Troponin i, poc 0.02  0.00 - 0.08 ng/mL   Comment 3            Comment: Due to the release kinetics of cTnI,     a negative result within the first hours     of the onset of symptoms does not rule out     myocardial infarction with certainty.     If myocardial infarction is still suspected,     repeat the test at appropriate intervals.    Dg Chest 2 View  09/05/2014   CLINICAL DATA:  Chest pain, history of COPD  EXAM: CHEST  2 VIEW  COMPARISON:  01/15/2012  FINDINGS: Heart size upper normal to mildly enlarged. Mild interstitial coarsening. Mild right greater than left lung base opacity. Mild scattered aortic atherosclerosis. No definite pleural effusion. No pneumothorax. Mild multilevel degenerative changes. Surgical clips project over the upper abdomen.  IMPRESSION: Mild lung base opacity; atelectasis versus infiltrate.  Interstitial coarsening/peribronchial thickening may reflect COPD with acute and/or chronic bronchitis.   Electronically Signed   By: Carlos Levering M.D.   On: 09/05/2014 21:30   Ct Angio Chest Aorta W/cm &/or Wo/cm  09/05/2014   CLINICAL DATA:  Acute chest and abdominal pain.  EXAM: CT ANGIOGRAPHY CHEST, ABDOMEN AND PELVIS  TECHNIQUE: Multidetector CT imaging through the chest, abdomen and pelvis was performed using the standard protocol during bolus administration of intravenous contrast. Multiplanar reconstructed images and MIPs were obtained and reviewed to evaluate the vascular anatomy.  CONTRAST:  100 mL of Omnipaque 350 intravenously.  COMPARISON:  CT scan of May 15, 2012.  FINDINGS: CTA CHEST FINDINGS  Mild bilateral apical scarring is noted. No pneumothorax or pleural effusion is noted. Mild emphysematous changes noted in the upper lobes bilaterally. There is  no evidence of thoracic aortic dissection or aneurysm. There is no  evidence of pulmonary embolus. Calcified bilateral hilar lymph nodes are noted. No other mediastinal mass or adenopathy is noted. 7 mm nodule is noted inferiorly in the left lingula. Great vessels are widely patent.  Review of the MIP images confirms the above findings.  CTA ABDOMEN AND PELVIS FINDINGS  There is no evidence of abdominal aortic aneurysm or dissection. The renal arteries are widely patent without significant stenosis. The celiac and superior mesenteric arteries are also widely patent without significant stenosis. There is moderate stenosis involving the origin of the inferior mesenteric artery. Mild atherosclerotic calcifications of the abdominal aorta and iliac arteries are noted. Status post cholecystectomy. Bilateral renal cysts are again noted and unchanged compared to prior exam. Small amount of free fluid is noted around the liver and spleen. No significant abnormality is noted in the liver, spleen or pancreas. The appendix appears normal. Urinary bladder appears normal. No significant adenopathy is noted. Moderate to severe proximal small bowel dilatation is noted with possible transition zone seen in the central portion of the abdomen anteriorly best seen on image number 178 of series 5. This is concerning for possible adhesion.  Review of the MIP images confirms the above findings.  IMPRESSION: No evidence of thoracic or abdominal aortic dissection or aneurysm.  No evidence of pulmonary embolus.  Moderate to severe proximal small bowel dilatation is noted with possible transition zone seen in the central portion of the abdomen. This is concerning for possible adhesion.  7 mm lingular nodule is noted. If the patient is at high risk for bronchogenic carcinoma, follow-up chest CT at 3-74month is recommended. If the patient is at low risk for bronchogenic carcinoma, follow-up chest CT at 6-12 months is recommended. This recommendation follows the consensus statement: Guidelines for Management of  Small Pulmonary Nodules Detected on CT Scans: A Statement from the [employer] published in Radiology 2005; 237:395-400.   Electronically Signed   By: JSabino DickM.D.   On: 09/05/2014 22:31   Ct Angio Abd/pel W/ And/or W/o  09/05/2014   CLINICAL DATA:  Acute chest and abdominal pain.  EXAM: CT ANGIOGRAPHY CHEST, ABDOMEN AND PELVIS  TECHNIQUE: Multidetector CT imaging through the chest, abdomen and pelvis was performed using the standard protocol during bolus administration of intravenous contrast. Multiplanar reconstructed images and MIPs were obtained and reviewed to evaluate the vascular anatomy.  CONTRAST:  100 mL of Omnipaque 350 intravenously.  COMPARISON:  CT scan of May 15, 2012.  FINDINGS: CTA CHEST FINDINGS  Mild bilateral apical scarring is noted. No pneumothorax or pleural effusion is noted. Mild emphysematous changes noted in the upper lobes bilaterally. There is no evidence of thoracic aortic dissection or aneurysm. There is no evidence of pulmonary embolus. Calcified bilateral hilar lymph nodes are noted. No other mediastinal mass or adenopathy is noted. 7 mm nodule is noted inferiorly in the left lingula. Great vessels are widely patent.  Review of the MIP images confirms the above findings.  CTA ABDOMEN AND PELVIS FINDINGS  There is no evidence of abdominal aortic aneurysm or dissection. The renal arteries are widely patent without significant stenosis. The celiac and superior mesenteric arteries are also widely patent without significant stenosis. There is moderate stenosis involving the origin of the inferior mesenteric artery. Mild atherosclerotic calcifications of the abdominal aorta and iliac arteries are noted. Status post cholecystectomy. Bilateral renal cysts are again noted and unchanged compared to prior exam. Small amount of  free fluid is noted around the liver and spleen. No significant abnormality is noted in the liver, spleen or pancreas. The appendix appears normal.  Urinary bladder appears normal. No significant adenopathy is noted. Moderate to severe proximal small bowel dilatation is noted with possible transition zone seen in the central portion of the abdomen anteriorly best seen on image number 178 of series 5. This is concerning for possible adhesion.  Review of the MIP images confirms the above findings.  IMPRESSION: No evidence of thoracic or abdominal aortic dissection or aneurysm.  No evidence of pulmonary embolus.  Moderate to severe proximal small bowel dilatation is noted with possible transition zone seen in the central portion of the abdomen. This is concerning for possible adhesion.  7 mm lingular nodule is noted. If the patient is at high risk for bronchogenic carcinoma, follow-up chest CT at 3-85month is recommended. If the patient is at low risk for bronchogenic carcinoma, follow-up chest CT at 6-12 months is recommended. This recommendation follows the consensus statement: Guidelines for Management of Small Pulmonary Nodules Detected on CT Scans: A Statement from the [employer] published in Radiology 2005; 237:395-400.   Electronically Signed   By: JSabino DickM.D.   On: 09/05/2014 22:31    Review of Systems  Constitutional: Negative.   HENT: Negative.   Eyes: Negative.   Respiratory: Negative.   Cardiovascular: Negative.   Gastrointestinal: Positive for nausea, vomiting and abdominal pain.       Bloating  Genitourinary: Negative.   Musculoskeletal: Negative.   Skin: Negative.   Neurological: Positive for sensory change (hard of hearing).  Endo/Heme/Allergies: Negative.   Psychiatric/Behavioral: Negative.    Blood pressure 192/100, pulse 73, temperature 98 F (36.7 C), temperature source Oral, resp. rate 23, SpO2 96.00%. Physical Exam  Constitutional: He is oriented to person, place, and time. He appears well-developed and well-nourished. No distress.  HENT:  Head: Normocephalic and atraumatic.  Mouth/Throat: Oropharynx is  clear and moist.  Eyes: Conjunctivae are normal. Pupils are equal, round, and reactive to light. Right eye exhibits no discharge. Left eye exhibits no discharge. No scleral icterus.  Neck: Normal range of motion. No tracheal deviation present. No thyromegaly present.  Cardiovascular: Normal rate and intact distal pulses.   Respiratory: Effort normal. No respiratory distress.  GI: Soft. He exhibits distension (mild). There is no tenderness. There is no rebound and no guarding.  Musculoskeletal: Normal range of motion.  Neurological: He is alert and oriented to person, place, and time.  Skin: Skin is warm and dry. No rash noted. He is not diaphoretic. No erythema. No pallor.  Psychiatric: He has a normal mood and affect. His behavior is normal. Judgment and thought content normal.    Assessment/Plan: SBO Likely secondary to adhesions. NPO NGT IVF  Repeat films tomorrow. Hopefully this will resolve with non operative care.    Ailah Barna 09/05/2014, 11:34 PM

## 2014-09-05 NOTE — ED Provider Notes (Signed)
CSN: [id number]     Arrival date & time 09/05/14  1644 History   First MD Initiated Contact with Patient 09/05/14 1925     Chief Complaint  Patient presents with  . Chest Pain     (Consider location/radiation/quality/duration/timing/severity/associated sxs/prior Treatment) HPI Comments: Jim Shields is a 80 y.o. male with a PMHx of COPD, HTN, DM2, HLD, and MI, who presents to the ED with complaints of gradual onset CP x3 weeks and 2 days of abd pain/N/V. Pt accompanied by his spouse who helps with history due to pt's difficulty hearing as well as not knowing specifics regarding his illness. Pt reports that he has had intermittent 10/10 aching chest pain generalized in his entire chest, which has no specific eliciting factor/context, and is nonradiating. States that it has no aggravating or alleviating factors, but that walking around occasionally brings it on, but can't specify if it resolves at rest or not. Also cannot report whether breathing or movement changes his symptoms. Pt also here for abd pain which he states is generalized but mostly epigastric, starting approx 2 days ago, "sore" and burning, nonradiating, with no known aggravating or alleviating factors. He has had associated nonbloody nonbilious emesis x2 days and has not been able to take any of his home medications due to the vomiting. Denies fevers, chills, HA, vision changes, syncope, lightheadedness, cough, hemoptysis, DOE, SOB, chest tightness or pressure, radiation to left arm, jaw or back, diaphoresis, hematemesis, hematochezia, melena, diarrhea, constipation, obstipation, dysuria, hematuria, paresthesias, myalgias, arthralgias, weakness, or LE swelling. Last BM yesterday. Smokes 1/4ppd. PCP Dr[facility]   Patient is a 80 y.o. male presenting with chest pain. The history is provided by the patient and the spouse. No language interpreter was used.  Chest Pain Pain location:  Unable to specify Pain quality:  aching   Pain radiates to:  Does not radiate Pain radiates to the back: no   Pain severity:  Severe (10/10) Onset quality:  Gradual Duration:  3 weeks Timing:  Intermittent Progression:  Unchanged Chronicity:  New Context comment:  Unable to specify Relieved by:  None tried Worsened by:  Movement (walking around occasionally brings it on, but unable to specify if exertional or movement) Ineffective treatments:  None tried Associated symptoms: abdominal pain, heartburn, nausea and vomiting   Associated symptoms: no anorexia, no back pain, no cough, no diaphoresis, no dizziness, no fever, no headache, no lower extremity edema, no near-syncope, no numbness, no shortness of breath, no syncope and no weakness   Abdominal pain:    Location:  Epigastric   Quality:  Aching and burning   Severity:  Moderate   Onset quality:  Gradual   Duration:  2 days   Timing:  Constant   Progression:  Unchanged   Chronicity:  Recurrent Risk factors: coronary artery disease, diabetes mellitus, high cholesterol, hypertension, male sex and smoking     Past Medical History  Diagnosis Date  . COPD (chronic obstructive pulmonary disease)   . Hypertension   . Diabetes mellitus   . MI (myocardial infarction)   . Hyperlipidemia 01/13/2012  . HTN (hypertension) 01/13/2012  . Diabetes mellitus, type 2 01/13/2012   Past Surgical History  Procedure Laterality Date  . Cholecystectomy     History reviewed. No pertinent family history. History  Substance Use Topics  . Smoking status: Former Games developermoker  . Smokeless tobacco: Not on file  . Alcohol Use: No    Review of Systems  Constitutional: Negative for fever, chills  and diaphoresis.  HENT: Negative for congestion and sore throat.   Eyes: Negative for photophobia and visual disturbance.  Respiratory: Negative for cough, chest tightness, shortness of breath and wheezing.   Cardiovascular: Positive for chest pain. Negative for leg swelling, syncope and  near-syncope.  Gastrointestinal: Positive for heartburn, nausea, vomiting and abdominal pain. Negative for diarrhea, constipation, blood in stool, abdominal distention and anorexia.  Genitourinary: Negative for dysuria and hematuria.  Musculoskeletal: Negative for arthralgias, back pain, gait problem and myalgias.  Skin: Negative for color change.  Neurological: Negative for dizziness, syncope, weakness, light-headedness, numbness and headaches.  Psychiatric/Behavioral: Negative for confusion.   10 Systems reviewed and are negative for acute change except as noted in the HPI.    Allergies  Review of patient's allergies indicates no known allergies.  Home Medications   Prior to Admission medications   Medication Sig Start Date End Date Taking? Authorizing Provider  amLODipine (NORVASC) 5 MG tablet Take 5 mg by mouth daily.    Historical Provider, MD  aspirin 81 MG chewable tablet Chew 81 mg by mouth daily.    Historical Provider, MD  buPROPion (WELLBUTRIN XL) 150 MG 24 hr tablet Take 150 mg by mouth daily.    Historical Provider, MD  insulin glargine (LANTUS) 100 UNIT/ML injection Inject 20 Units into the skin every morning.     Historical Provider, MD  latanoprost (XALATAN) 0.005 % ophthalmic solution Place 1 drop into both eyes at bedtime.    Historical Provider, MD  lisinopril (PRINIVIL,ZESTRIL) 40 MG tablet Take 40 mg by mouth daily.    Historical Provider, MD  magnesium oxide (MAG-OX) 400 (241.3 MG) MG tablet Take 1 tablet (400 mg total) by mouth 2 (two) times daily. X 1 week 10/28/13   Ripudeep Jenna Luo, MD  meloxicam (MOBIC) 7.5 MG tablet Take 7.5 mg by mouth daily.    Historical Provider, MD  metFORMIN (GLUCOPHAGE) 1000 MG tablet Take 1,000 mg by mouth 2 (two) times daily with a meal.    Historical Provider, MD  metoprolol succinate (TOPROL-XL) 25 MG 24 hr tablet Take 25 mg by mouth daily.    Historical Provider, MD  Multiple Vitamin (MULITIVITAMIN WITH MINERALS) TABS Take 1 tablet  by mouth daily.    Historical Provider, MD   BP 225/117  Pulse 75  Temp(Src) 98 F (36.7 C) (Oral)  Resp 18  SpO2 100% Physical Exam  Nursing note and vitals reviewed. Constitutional: He is oriented to person, place, and time.  Non-toxic appearance. No distress.  Thin frail appearing gentleman sitting upright in bed, nontoxic, afebrile, NAD but with HTN noted  HENT:  Head: Normocephalic and atraumatic.  Nose: Nose normal.  Mouth/Throat: Uvula is midline, oropharynx is clear and moist and mucous membranes are normal.  Eyes: Conjunctivae and EOM are normal. Right eye exhibits no discharge. Left eye exhibits no discharge.  Arcus sinilus in b/l eyes, pupils pinpoint  Neck: Normal range of motion. Neck supple. No JVD present.  Cardiovascular: Normal rate, regular rhythm, normal heart sounds and intact distal pulses.  Exam reveals no gallop and no friction rub.   No murmur heard. RRR, nl s1/s2, no m/r/g  Pulmonary/Chest: Effort normal and breath sounds normal. No respiratory distress. He has no decreased breath sounds. He has no wheezes. He has no rhonchi. He has no rales. He exhibits no tenderness.  CTAB in all lung fields although poor inspiratory effort and difficult to auscultate lower lung fields. No increased WOB. No chest wall TTP  Abdominal:  Soft. Normal appearance and bowel sounds are normal. He exhibits no distension and no pulsatile midline mass. There is tenderness in the right lower quadrant. There is no rigidity, no rebound, no guarding, no CVA tenderness and no tenderness at McBurney's point.    Soft, nondistended, +BS throughout although mildly decreased in RLQ, with moderate generalized abd tenderness but mostly localized in RLQ, no r/g/r, no Mcburney's point TTP, no CVA TTP. No pulsatile midline mass  Musculoskeletal: Normal range of motion.  Neurological: He is alert and oriented to person, place, and time.  Skin: Skin is warm, dry and intact. No rash noted.  Psychiatric:  He has a normal mood and affect.    ED Course  Procedures (including critical care time) Labs Review Labs Reviewed  BASIC METABOLIC PANEL - Abnormal; Notable for the following:    Potassium 3.1 (*)    Glucose, Bld 214 (*)    GFR calc non Af Amer 66 (*)    GFR calc Af Amer 76 (*)    All other components within normal limits  CBC  URINALYSIS, ROUTINE W REFLEX MICROSCOPIC  HEPATIC FUNCTION PANEL  LIPASE, BLOOD  I-STAT TROPOININ, ED    Imaging Review Dg Chest 2 View  09/05/2014   CLINICAL DATA:  Chest pain, history of COPD  EXAM: CHEST  2 VIEW  COMPARISON:  01/15/2012  FINDINGS: Heart size upper normal to mildly enlarged. Mild interstitial coarsening. Mild right greater than left lung base opacity. Mild scattered aortic atherosclerosis. No definite pleural effusion. No pneumothorax. Mild multilevel degenerative changes. Surgical clips project over the upper abdomen.  IMPRESSION: Mild lung base opacity; atelectasis versus infiltrate.  Interstitial coarsening/peribronchial thickening may reflect COPD with acute and/or chronic bronchitis.   Electronically Signed   By: Jearld Lesch M.D.   On: 09/05/2014 21:30   Ct Angio Chest Aorta W/cm &/or Wo/cm  09/05/2014   CLINICAL DATA:  Acute chest and abdominal pain.  EXAM: CT ANGIOGRAPHY CHEST, ABDOMEN AND PELVIS  TECHNIQUE: Multidetector CT imaging through the chest, abdomen and pelvis was performed using the standard protocol during bolus administration of intravenous contrast. Multiplanar reconstructed images and MIPs were obtained and reviewed to evaluate the vascular anatomy.  CONTRAST:  100 mL of Omnipaque 350 intravenously.  COMPARISON:  CT scan of May 15, 2012.  FINDINGS: CTA CHEST FINDINGS  Mild bilateral apical scarring is noted. No pneumothorax or pleural effusion is noted. Mild emphysematous changes noted in the upper lobes bilaterally. There is no evidence of thoracic aortic dissection or aneurysm. There is no evidence of pulmonary  embolus. Calcified bilateral hilar lymph nodes are noted. No other mediastinal mass or adenopathy is noted. 7 mm nodule is noted inferiorly in the left lingula. Great vessels are widely patent.  Review of the MIP images confirms the above findings.  CTA ABDOMEN AND PELVIS FINDINGS  There is no evidence of abdominal aortic aneurysm or dissection. The renal arteries are widely patent without significant stenosis. The celiac and superior mesenteric arteries are also widely patent without significant stenosis. There is moderate stenosis involving the origin of the inferior mesenteric artery. Mild atherosclerotic calcifications of the abdominal aorta and iliac arteries are noted. Status post cholecystectomy. Bilateral renal cysts are again noted and unchanged compared to prior exam. Small amount of free fluid is noted around the liver and spleen. No significant abnormality is noted in the liver, spleen or pancreas. The appendix appears normal. Urinary bladder appears normal. No significant adenopathy is noted. Moderate to severe proximal small bowel  dilatation is noted with possible transition zone seen in the central portion of the abdomen anteriorly best seen on image number 178 of series 5. This is concerning for possible adhesion.  Review of the MIP images confirms the above findings.  IMPRESSION: No evidence of thoracic or abdominal aortic dissection or aneurysm.  No evidence of pulmonary embolus.  Moderate to severe proximal small bowel dilatation is noted with possible transition zone seen in the central portion of the abdomen. This is concerning for possible adhesion.  7 mm lingular nodule is noted. If the patient is at high risk for bronchogenic carcinoma, follow-up chest CT at 3-32months is recommended. If the patient is at low risk for bronchogenic carcinoma, follow-up chest CT at 6-12 months is recommended. This recommendation follows the consensus statement: Guidelines for Management of Small Pulmonary  Nodules Detected on CT Scans: A Statement from the Fleischner Society as published in Radiology 2005; 237:395-400.   Electronically Signed   By: Roque Lias M.D.   On: 09/05/2014 22:31   Ct Angio Abd/pel W/ And/or W/o  09/05/2014   CLINICAL DATA:  Acute chest and abdominal pain.  EXAM: CT ANGIOGRAPHY CHEST, ABDOMEN AND PELVIS  TECHNIQUE: Multidetector CT imaging through the chest, abdomen and pelvis was performed using the standard protocol during bolus administration of intravenous contrast. Multiplanar reconstructed images and MIPs were obtained and reviewed to evaluate the vascular anatomy.  CONTRAST:  100 mL of Omnipaque 350 intravenously.  COMPARISON:  CT scan of May 15, 2012.  FINDINGS: CTA CHEST FINDINGS  Mild bilateral apical scarring is noted. No pneumothorax or pleural effusion is noted. Mild emphysematous changes noted in the upper lobes bilaterally. There is no evidence of thoracic aortic dissection or aneurysm. There is no evidence of pulmonary embolus. Calcified bilateral hilar lymph nodes are noted. No other mediastinal mass or adenopathy is noted. 7 mm nodule is noted inferiorly in the left lingula. Great vessels are widely patent.  Review of the MIP images confirms the above findings.  CTA ABDOMEN AND PELVIS FINDINGS  There is no evidence of abdominal aortic aneurysm or dissection. The renal arteries are widely patent without significant stenosis. The celiac and superior mesenteric arteries are also widely patent without significant stenosis. There is moderate stenosis involving the origin of the inferior mesenteric artery. Mild atherosclerotic calcifications of the abdominal aorta and iliac arteries are noted. Status post cholecystectomy. Bilateral renal cysts are again noted and unchanged compared to prior exam. Small amount of free fluid is noted around the liver and spleen. No significant abnormality is noted in the liver, spleen or pancreas. The appendix appears normal. Urinary bladder  appears normal. No significant adenopathy is noted. Moderate to severe proximal small bowel dilatation is noted with possible transition zone seen in the central portion of the abdomen anteriorly best seen on image number 178 of series 5. This is concerning for possible adhesion.  Review of the MIP images confirms the above findings.  IMPRESSION: No evidence of thoracic or abdominal aortic dissection or aneurysm.  No evidence of pulmonary embolus.  Moderate to severe proximal small bowel dilatation is noted with possible transition zone seen in the central portion of the abdomen. This is concerning for possible adhesion.  7 mm lingular nodule is noted. If the patient is at high risk for bronchogenic carcinoma, follow-up chest CT at 3-24months is recommended. If the patient is at low risk for bronchogenic carcinoma, follow-up chest CT at 6-12 months is recommended. This recommendation follows the consensus statement: Guidelines  for Management of Small Pulmonary Nodules Detected on CT Scans: A Statement from the Fleischner Society as published in Radiology 2005; 237:395-400.   Electronically Signed   By: Roque LiasJames  Green M.D.   On: 09/05/2014 22:31     EKG Interpretation   Date/Time:  Monday September 05 2014 16:48:51 EDT Ventricular Rate:  67 PR Interval:  152 QRS Duration: 96 QT Interval:  438 QTC Calculation: 462 R Axis:   69 Text Interpretation:  Sinus rhythm with Premature atrial complexes Left  ventricular hypertrophy with repolarization abnormality Abnormal ECG No  significant change since last tracing Confirmed by Mirian MoGentry, Matthew [phone]([id number])  on 09/05/2014 7:27:13 PM      MDM   Final diagnoses:  Chest pain  Abdominal pain, acute  SBO (small bowel obstruction)    80y/o male who is a poor historian, here with vague CP x3 wks but then having abd pain/n/v x2 days. BP very elevated here. EKG unchanged from prior, BMP showing baseline hypokalemia and hyperglycemia but renal function preserved. CBC  WNL, trop WNL. Will obtain CXR and CTA chest/abd/pelvis to eval for dissection as well as r/o intraabd process, DDx of abd pain includes possibly constipation vs obstruction vs GERD. Given labetalol 10mg  with no decrease in BP, will re-dose. Will also give GI cocktail.   10:46 PM BP mildly improved down to 170s/80s after labetalol. GI cocktail not helping much, will give dilaudid to help with pain control. CT showing possible SBO and concern for adhesions. No ongoing vomiting. Will consult surgery for SBO. Dr. Donell BeersByerly returning page, would like medical admission and will consult after admission, and would like NG tube placed now. Pt stable at this time with improved pain after dilaudid.  11:15 PM Dr. Onalee Huaavid of [facility] returning page. Will admit to tele. Would like LFTs and lipase drawn now. Pt stable at this time. Last BP 176/86, has not been loaded into EMR yet, awaiting nursing to accept vitals, but seen on monitor.     Donnita FallsMercedes Strupp Park[place]amprubi-[place], [place]PA-C 09/05/14 2316

## 2014-09-05 NOTE — H&P (Signed)
Chief Complaint:  abd pain  HPI: 80 yo male comes in with several weeks of waxing/waning abd pain with n/v that has worsened in the last couple of days.  No fevers.  No diarrhea.  Vomit no blood.  Denies any cough and pain is mostly in abdomenal area but sometimes radiates into chest.  No swelling in le or wt gain.  Ct show psbo.  He feels better in ED with pain meds and antiemetics.  Review of Systems:  Positive and negative as per HPI otherwise all other systems are negative  Past Medical History: Past Medical History  Diagnosis Date  . COPD (chronic obstructive pulmonary disease)   . Hypertension   . Diabetes mellitus   . MI (myocardial infarction)   . Hyperlipidemia 01/13/2012  . HTN (hypertension) 01/13/2012  . Diabetes mellitus, type 2 01/13/2012   Past Surgical History  Procedure Laterality Date  . Cholecystectomy      Medications: Prior to Admission medications   Medication Sig Start Date End Date Taking? Authorizing Provider  acetaminophen (TYLENOL) 325 MG tablet Take 650 mg by mouth every 6 (six) hours as needed.   Yes Historical Provider, MD  albuterol (PROVENTIL) (2.5 MG/3ML) 0.083% nebulizer solution Take 2.5 mg by nebulization every 6 (six) hours as needed for wheezing or shortness of breath.   Yes Historical Provider, MD  amLODipine (NORVASC) 5 MG tablet Take 5 mg by mouth daily.   Yes Historical Provider, MD  aspirin 81 MG chewable tablet Chew 81 mg by mouth daily.   Yes Historical Provider, MD  atorvastatin (LIPITOR) 40 MG tablet Take 40 mg by mouth every evening.   Yes Historical Provider, MD  buPROPion (WELLBUTRIN XL) 150 MG 24 hr tablet Take 150 mg by mouth daily.   Yes Historical Provider, MD  Fluticasone-Salmeterol (ADVAIR) 250-50 MCG/DOSE AEPB Inhale 1 puff into the lungs 2 (two) times daily.   Yes Historical Provider, MD  hydrochlorothiazide (HYDRODIURIL) 25 MG tablet Take 25 mg by mouth daily.   Yes Historical Provider, MD  insulin glargine (LANTUS) 100  UNIT/ML injection Inject 20 Units into the skin every morning.    Yes Historical Provider, MD  latanoprost (XALATAN) 0.005 % ophthalmic solution Place 1 drop into both eyes at bedtime.   Yes Historical Provider, MD  lisinopril (PRINIVIL,ZESTRIL) 40 MG tablet Take 40 mg by mouth daily.   Yes Historical Provider, MD  meloxicam (MOBIC) 7.5 MG tablet Take 7.5 mg by mouth daily.   Yes Historical Provider, MD  metFORMIN (GLUCOPHAGE) 1000 MG tablet Take 1,000 mg by mouth 2 (two) times daily with a meal.   Yes Historical Provider, MD  metoprolol succinate (TOPROL-XL) 25 MG 24 hr tablet Take 25 mg by mouth daily.   Yes Historical Provider, MD  Multiple Vitamin (MULITIVITAMIN WITH MINERALS) TABS Take 1 tablet by mouth daily.   Yes Historical Provider, MD  tiotropium (SPIRIVA) 18 MCG inhalation capsule Place 18 mcg into inhaler and inhale daily.   Yes Historical Provider, MD    Allergies:  No Known Allergies  Social History:  reports that he has quit smoking. He does not have any smokeless tobacco history on file. He reports that he does not drink alcohol or use illicit drugs.  Family History: History reviewed. No pertinent family history.  Physical Exam: Filed Vitals:   09/05/14 2030 09/05/14 2045 09/05/14 2215 09/05/14 2230  BP: 191/85  192/100   Pulse: 70 82 76 73  Temp:      TempSrc:  Resp: 26 17 22 23   SpO2: 97% 94% 96% 96%   General appearance: alert, cooperative and no distress Head: Normocephalic, without obvious abnormality, atraumatic Eyes: negative Nose: Nares normal. Septum midline. Mucosa normal. No drainage or sinus tenderness. Neck: no JVD and supple, symmetrical, trachea midline Lungs: clear to auscultation bilaterally Heart: regular rate and rhythm, S1, S2 normal, no murmur, click, rub or gallop Abdomen: soft, non-tender; bowel sounds normal; no masses,  no organomegaly  Mild ttp diffusely no r/g nonacute abd Extremities: extremities normal, atraumatic, no cyanosis or  edema Pulses: 2+ and symmetric Skin: Skin color, texture, turgor normal. No rashes or lesions Neurologic: Grossly normal    Labs on Admission:   Recent Labs  09/05/14 1650  NA 140  K 3.1*  CL 98  CO2 30  GLUCOSE 214*  BUN 8  CREATININE 1.04  CALCIUM 9.5    Recent Labs  09/05/14 1650  WBC 5.9  HGB 15.7  HCT 44.5  MCV 84.3  PLT 152    Radiological Exams on Admission: Dg Chest 2 View  09/05/2014   CLINICAL DATA:  Chest pain, history of COPD  EXAM: CHEST  2 VIEW  COMPARISON:  01/15/2012  FINDINGS: Heart size upper normal to mildly enlarged. Mild interstitial coarsening. Mild right greater than left lung base opacity. Mild scattered aortic atherosclerosis. No definite pleural effusion. No pneumothorax. Mild multilevel degenerative changes. Surgical clips project over the upper abdomen.  IMPRESSION: Mild lung base opacity; atelectasis versus infiltrate.  Interstitial coarsening/peribronchial thickening may reflect COPD with acute and/or chronic bronchitis.   Electronically Signed   By: Jearld Lesch M.D.   On: 09/05/2014 21:30   Ct Angio Chest Aorta W/cm &/or Wo/cm  09/05/2014   CLINICAL DATA:  Acute chest and abdominal pain.  EXAM: CT ANGIOGRAPHY CHEST, ABDOMEN AND PELVIS  TECHNIQUE: Multidetector CT imaging through the chest, abdomen and pelvis was performed using the standard protocol during bolus administration of intravenous contrast. Multiplanar reconstructed images and MIPs were obtained and reviewed to evaluate the vascular anatomy.  CONTRAST:  100 mL of Omnipaque 350 intravenously.  COMPARISON:  CT scan of May 15, 2012.  FINDINGS: CTA CHEST FINDINGS  Mild bilateral apical scarring is noted. No pneumothorax or pleural effusion is noted. Mild emphysematous changes noted in the upper lobes bilaterally. There is no evidence of thoracic aortic dissection or aneurysm. There is no evidence of pulmonary embolus. Calcified bilateral hilar lymph nodes are noted. No other  mediastinal mass or adenopathy is noted. 7 mm nodule is noted inferiorly in the left lingula. Great vessels are widely patent.  Review of the MIP images confirms the above findings.  CTA ABDOMEN AND PELVIS FINDINGS  There is no evidence of abdominal aortic aneurysm or dissection. The renal arteries are widely patent without significant stenosis. The celiac and superior mesenteric arteries are also widely patent without significant stenosis. There is moderate stenosis involving the origin of the inferior mesenteric artery. Mild atherosclerotic calcifications of the abdominal aorta and iliac arteries are noted. Status post cholecystectomy. Bilateral renal cysts are again noted and unchanged compared to prior exam. Small amount of free fluid is noted around the liver and spleen. No significant abnormality is noted in the liver, spleen or pancreas. The appendix appears normal. Urinary bladder appears normal. No significant adenopathy is noted. Moderate to severe proximal small bowel dilatation is noted with possible transition zone seen in the central portion of the abdomen anteriorly best seen on image number 178 of series 5. This is  concerning for possible adhesion.  Review of the MIP images confirms the above findings.  IMPRESSION: No evidence of thoracic or abdominal aortic dissection or aneurysm.  No evidence of pulmonary embolus.  Moderate to severe proximal small bowel dilatation is noted with possible transition zone seen in the central portion of the abdomen. This is concerning for possible adhesion.  7 mm lingular nodule is noted. If the patient is at high risk for bronchogenic carcinoma, follow-up chest CT at 3-356months is recommended. If the patient is at low risk for bronchogenic carcinoma, follow-up chest CT at 6-12 months is recommended. This recommendation follows the consensus statement: Guidelines for Management of Small Pulmonary Nodules Detected on CT Scans: A Statement from the Fleischner Society as  published in Radiology 2005; 237:395-400.   Electronically Signed   By: Roque LiasJames  Green M.D.   On: 09/05/2014 22:31   Ct Angio Abd/pel W/ And/or W/o  09/05/2014   CLINICAL DATA:  Acute chest and abdominal pain.  EXAM: CT ANGIOGRAPHY CHEST, ABDOMEN AND PELVIS  TECHNIQUE: Multidetector CT imaging through the chest, abdomen and pelvis was performed using the standard protocol during bolus administration of intravenous contrast. Multiplanar reconstructed images and MIPs were obtained and reviewed to evaluate the vascular anatomy.  CONTRAST:  100 mL of Omnipaque 350 intravenously.  COMPARISON:  CT scan of May 15, 2012.  FINDINGS: CTA CHEST FINDINGS  Mild bilateral apical scarring is noted. No pneumothorax or pleural effusion is noted. Mild emphysematous changes noted in the upper lobes bilaterally. There is no evidence of thoracic aortic dissection or aneurysm. There is no evidence of pulmonary embolus. Calcified bilateral hilar lymph nodes are noted. No other mediastinal mass or adenopathy is noted. 7 mm nodule is noted inferiorly in the left lingula. Great vessels are widely patent.  Review of the MIP images confirms the above findings.  CTA ABDOMEN AND PELVIS FINDINGS  There is no evidence of abdominal aortic aneurysm or dissection. The renal arteries are widely patent without significant stenosis. The celiac and superior mesenteric arteries are also widely patent without significant stenosis. There is moderate stenosis involving the origin of the inferior mesenteric artery. Mild atherosclerotic calcifications of the abdominal aorta and iliac arteries are noted. Status post cholecystectomy. Bilateral renal cysts are again noted and unchanged compared to prior exam. Small amount of free fluid is noted around the liver and spleen. No significant abnormality is noted in the liver, spleen or pancreas. The appendix appears normal. Urinary bladder appears normal. No significant adenopathy is noted. Moderate to severe  proximal small bowel dilatation is noted with possible transition zone seen in the central portion of the abdomen anteriorly best seen on image number 178 of series 5. This is concerning for possible adhesion.  Review of the MIP images confirms the above findings.  IMPRESSION: No evidence of thoracic or abdominal aortic dissection or aneurysm.  No evidence of pulmonary embolus.  Moderate to severe proximal small bowel dilatation is noted with possible transition zone seen in the central portion of the abdomen. This is concerning for possible adhesion.  7 mm lingular nodule is noted. If the patient is at high risk for bronchogenic carcinoma, follow-up chest CT at 3-816months is recommended. If the patient is at low risk for bronchogenic carcinoma, follow-up chest CT at 6-12 months is recommended. This recommendation follows the consensus statement: Guidelines for Management of Small Pulmonary Nodules Detected on CT Scans: A Statement from the Fleischner Society as published in Radiology 2005; 237:395-400.   Electronically Signed  By: Roque Lias M.D.   On: 09/05/2014 22:31    Assessment/Plan  80 yo male with n/v /abd pain c/w psbo  Principal Problem:   SBO (small bowel obstruction)-  Conservative tx at this time.  gen surgery consulted and to see in am.  Keep npo except meds and hold anticoagulation until seen by general surgery.  ivf overnight.   Cont iv pain and antiemetics.  ngt has been requested by general surgery.  Active Problems:  Stable unless o/w noted   HTN (hypertension)-  Prn hydralazine ordered with parameters   COPD (chronic obstructive pulmonary disease)   Abdominal pain, acute   Nausea & vomiting  Admit to tele due to uncontrolled bp,  Full code.  Coty Student A 09/05/2014, 11:30 PM

## 2014-09-06 ENCOUNTER — Encounter (HOSPITAL_COMMUNITY): Payer: Self-pay

## 2014-09-06 DIAGNOSIS — J449 Chronic obstructive pulmonary disease, unspecified: Secondary | ICD-10-CM

## 2014-09-06 DIAGNOSIS — K565 Intestinal adhesions [bands] with obstruction (postprocedural) (postinfection): Principal | ICD-10-CM

## 2014-09-06 LAB — CBC
HCT: 45 % (ref 39.0–52.0)
Hemoglobin: 15.7 g/dL (ref 13.0–17.0)
MCH: 30.1 pg (ref 26.0–34.0)
MCHC: 34.9 g/dL (ref 30.0–36.0)
MCV: 86.2 fL (ref 78.0–100.0)
PLATELETS: 157 10*3/uL (ref 150–400)
RBC: 5.22 MIL/uL (ref 4.22–5.81)
RDW: 13.5 % (ref 11.5–15.5)
WBC: 9 10*3/uL (ref 4.0–10.5)

## 2014-09-06 LAB — URINE MICROSCOPIC-ADD ON

## 2014-09-06 LAB — BASIC METABOLIC PANEL
Anion gap: 18 — ABNORMAL HIGH (ref 5–15)
BUN: 11 mg/dL (ref 6–23)
CO2: 27 mEq/L (ref 19–32)
Calcium: 9.4 mg/dL (ref 8.4–10.5)
Chloride: 95 mEq/L — ABNORMAL LOW (ref 96–112)
Creatinine, Ser: 1.03 mg/dL (ref 0.50–1.35)
GFR calc non Af Amer: 66 mL/min — ABNORMAL LOW (ref >90)
GFR, EST AFRICAN AMERICAN: 77 mL/min — AB (ref >90)
GLUCOSE: 317 mg/dL — AB (ref 70–99)
POTASSIUM: 3.1 meq/L — AB (ref 3.7–5.3)
SODIUM: 140 meq/L (ref 137–147)

## 2014-09-06 LAB — URINALYSIS, ROUTINE W REFLEX MICROSCOPIC
BILIRUBIN URINE: NEGATIVE
Glucose, UA: >1000 mg/dL — AB
Ketones, ur: 15 mg/dL — AB
Leukocytes, UA: NEGATIVE
Nitrite: NEGATIVE
PH: 6 (ref 5.0–8.0)
Protein, ur: 100 mg/dL — AB
Specific Gravity, Urine: 1.015 (ref 1.005–1.030)
Urobilinogen, UA: 0.2 mg/dL (ref 0.0–1.0)

## 2014-09-06 LAB — GLUCOSE, CAPILLARY
GLUCOSE-CAPILLARY: 278 mg/dL — AB (ref 70–99)
Glucose-Capillary: 302 mg/dL — ABNORMAL HIGH (ref 70–99)
Glucose-Capillary: 232 mg/dL — ABNORMAL HIGH (ref 70–99)
Glucose-Capillary: 206 mg/dL — ABNORMAL HIGH (ref 70–99)
Glucose-Capillary: 182 mg/dL — ABNORMAL HIGH (ref 70–99)
Glucose-Capillary: 139 mg/dL — ABNORMAL HIGH (ref 70–99)

## 2014-09-06 LAB — HEMOGLOBIN A1C
HEMOGLOBIN A1C: 7.6 % — AB (ref <5.7)
MEAN PLASMA GLUCOSE: 171 mg/dL — AB (ref <117)

## 2014-09-06 MED ORDER — AMLODIPINE BESYLATE 10 MG PO TABS
10.0000 mg | ORAL_TABLET | Freq: Every day | ORAL | Status: DC
  Filled 2014-09-06: qty 1

## 2014-09-06 MED ORDER — SODIUM CHLORIDE 0.9 % IV SOLN
INTRAVENOUS | Status: DC
  Administered 2014-09-06: 75 mL/h via INTRAVENOUS

## 2014-09-06 MED ORDER — HYDRALAZINE HCL 20 MG/ML IJ SOLN
10.0000 mg | INTRAMUSCULAR | Status: DC | PRN
Start: 2014-09-06 — End: 2014-09-16
  Administered 2014-09-06: 10 mg via INTRAVENOUS
  Filled 2014-09-06: qty 1

## 2014-09-06 MED ORDER — ONDANSETRON HCL 4 MG/2ML IJ SOLN: 4.0000 mg | Freq: Three times a day (TID) | INTRAMUSCULAR | Status: DC | PRN

## 2014-09-06 MED ORDER — METOPROLOL SUCCINATE ER 25 MG PO TB24
25.0000 mg | ORAL_TABLET | Freq: Every day | ORAL | Status: DC
  Filled 2014-09-06 (×2): qty 1

## 2014-09-06 MED ORDER — MORPHINE SULFATE 2 MG/ML IJ SOLN
2.0000 mg | INTRAMUSCULAR | Status: DC | PRN
  Administered 2014-09-06 – 2014-09-15 (×8): 2 mg via INTRAVENOUS
  Filled 2014-09-06 (×9): qty 1

## 2014-09-06 MED ORDER — ALUM & MAG HYDROXIDE-SIMETH 200-200-20 MG/5ML PO SUSP: 30.0000 mL | Freq: Four times a day (QID) | ORAL | Status: DC | PRN

## 2014-09-06 MED ORDER — INSULIN GLARGINE 100 UNIT/ML ~~LOC~~ SOLN
10.0000 [IU] | Freq: Every day | SUBCUTANEOUS | Status: DC
  Administered 2014-09-06 – 2014-09-07 (×2): 10 [IU] via SUBCUTANEOUS
  Filled 2014-09-06 (×3): qty 0.1

## 2014-09-06 MED ORDER — AMLODIPINE BESYLATE 5 MG PO TABS
5.0000 mg | ORAL_TABLET | Freq: Every day | ORAL | Status: DC
  Filled 2014-09-06: qty 1

## 2014-09-06 MED ORDER — HYDROMORPHONE HCL 1 MG/ML IJ SOLN: 1.0000 mg | INTRAMUSCULAR | Status: AC | PRN

## 2014-09-06 MED ORDER — ALBUTEROL SULFATE (2.5 MG/3ML) 0.083% IN NEBU: 2.5000 mg | INHALATION_SOLUTION | Freq: Four times a day (QID) | RESPIRATORY_TRACT | Status: DC | PRN

## 2014-09-06 MED ORDER — SODIUM CHLORIDE 0.9 % IV SOLN
INTRAVENOUS | Status: AC
  Administered 2014-09-06: 02:00:00 via INTRAVENOUS

## 2014-09-06 MED ORDER — LISINOPRIL 40 MG PO TABS
40.0000 mg | ORAL_TABLET | Freq: Every day | ORAL | Status: DC
  Filled 2014-09-06 (×2): qty 1

## 2014-09-06 MED ORDER — TIOTROPIUM BROMIDE MONOHYDRATE 18 MCG IN CAPS
18.0000 ug | ORAL_CAPSULE | Freq: Every day | RESPIRATORY_TRACT | Status: DC
  Administered 2014-09-06 – 2014-09-15 (×9): 18 ug via RESPIRATORY_TRACT
  Filled 2014-09-06 (×2): qty 5

## 2014-09-06 MED ORDER — ALBUTEROL SULFATE (2.5 MG/3ML) 0.083% IN NEBU: 2.5000 mg | INHALATION_SOLUTION | RESPIRATORY_TRACT | Status: DC | PRN

## 2014-09-06 MED ORDER — ONDANSETRON HCL 4 MG PO TABS: 4.0000 mg | ORAL_TABLET | Freq: Four times a day (QID) | ORAL | Status: DC | PRN

## 2014-09-06 MED ORDER — LATANOPROST 0.005 % OP SOLN
1.0000 [drp] | Freq: Every day | OPHTHALMIC | Status: DC
  Administered 2014-09-08 – 2014-09-15 (×5): 1 [drp] via OPHTHALMIC
  Filled 2014-09-06: qty 2.5

## 2014-09-06 MED ORDER — IPRATROPIUM-ALBUTEROL 0.5-2.5 (3) MG/3ML IN SOLN
3.0000 mL | RESPIRATORY_TRACT | Status: DC
  Administered 2014-09-06 (×2): 3 mL via RESPIRATORY_TRACT
  Filled 2014-09-06 (×2): qty 3

## 2014-09-06 MED ORDER — INSULIN ASPART 100 UNIT/ML ~~LOC~~ SOLN
0.0000 [IU] | SUBCUTANEOUS | Status: DC
  Administered 2014-09-06: 7 [IU] via SUBCUTANEOUS
  Administered 2014-09-06 (×2): 3 [IU] via SUBCUTANEOUS
  Administered 2014-09-06: 2 [IU] via SUBCUTANEOUS
  Administered 2014-09-06: 5 [IU] via SUBCUTANEOUS
  Administered 2014-09-06 – 2014-09-07 (×3): 1 [IU] via SUBCUTANEOUS
  Administered 2014-09-07: 2 [IU] via SUBCUTANEOUS
  Administered 2014-09-07 – 2014-09-08 (×3): 1 [IU] via SUBCUTANEOUS
  Administered 2014-09-09: 2 [IU] via SUBCUTANEOUS
  Administered 2014-09-10: 3 [IU] via SUBCUTANEOUS
  Administered 2014-09-10 – 2014-09-11 (×6): 1 [IU] via SUBCUTANEOUS
  Administered 2014-09-12: 2 [IU] via SUBCUTANEOUS
  Administered 2014-09-12: 1 [IU] via SUBCUTANEOUS
  Administered 2014-09-12: 2 [IU] via SUBCUTANEOUS
  Administered 2014-09-12: 1 [IU] via SUBCUTANEOUS
  Administered 2014-09-12: 3 [IU] via SUBCUTANEOUS
  Administered 2014-09-13 (×2): 2 [IU] via SUBCUTANEOUS
  Administered 2014-09-13: 1 [IU] via SUBCUTANEOUS
  Administered 2014-09-14: 2 [IU] via SUBCUTANEOUS
  Administered 2014-09-14: 1 [IU] via SUBCUTANEOUS

## 2014-09-06 MED ORDER — POTASSIUM CHLORIDE IN NACL 40-0.9 MEQ/L-% IV SOLN
INTRAVENOUS | Status: DC
  Administered 2014-09-06 – 2014-09-08 (×2): 75 mL/h via INTRAVENOUS
  Filled 2014-09-06 (×6): qty 1000

## 2014-09-06 MED ORDER — ONDANSETRON HCL 4 MG/2ML IJ SOLN
4.0000 mg | Freq: Four times a day (QID) | INTRAMUSCULAR | Status: DC | PRN
  Administered 2014-09-06: 4 mg via INTRAVENOUS
  Filled 2014-09-06 (×2): qty 2

## 2014-09-06 NOTE — Progress Notes (Signed)
Patient ID: Jim BergeronWilliam Shields, male   DOB: August 31, 1931, 80 y.o.   MRN: [id number] [facility] PROGRESS NOTE  Jim BergeronWilliam Shields ION:[id number] DOB: August 31, 1931 DOA: 09/05/2014 [facility]  Brief narrative: 80 yo male with COPD, HTN, presented with main concern of several days duration of progressively worsening generalized abd pain associated with nausea and non bloody vomiting, poor oral intake. CT abd c/q pSBO, surgery team consulted adn [facility] asked to admit for further evaluation.   Assessment and Plan:    Principal Problem:   SBO (small bowel obstruction) - secondary to adhesions  - reports feeling better this AM - NGT still present, pt denies discomfort - keep NPO, plan for abd films in AM to follow up on progress - provide analgesia and antiemetics as needed Active Problems:   HTN (hypertension) - improved since admission but still elevated - continue Norvasc (increase the dose to 10 mg PO QD), Lisinopril, Metoprolol  - continue hydralazine as needed    COPD (chronic obstructive pulmonary disease) - clinically compensated, pt maintaining oxygen saturation at target range - continue to provide BD's scheduled and as needed   7 mm lingular nodule - noted on CT chest - pt with history of tobacco use - recommendation is to have repeat CT chest at 3-6 months to ensure resolution    DM type II - A1C 7.5 this admission - will continue SSI - recommendation is to add Lantus 10 U, agree with recommendation as CBG is still in 300's over the past 24 hours  - order placed    Hypokalemia - supplement with NS via IV  - repeat BMP in AM  DVT prophylaxis  SCD's  Code Status: Full Family Communication: Pt and wife at bedside Disposition Plan: Home when medically stable  IV Access:   Peripheral IV Procedures and diagnostic studies:    CXR 09/05/2014   Mild lung base opacity; atelectasis versus infiltrate.  Interstitial coarsening/peribronchial thickening  may reflect COPD with acute and/or chronic bronchitis.   CT Angio Chest Aorta W/cm &/or Wo/cm  09/05/2014  No evidence of thoracic or abdominal aortic dissection or aneurysm.  No evidence of pulmonary embolus.  Moderate to severe proximal small bowel dilatation is noted with possible transition zone seen in the central portion of the abdomen. This is concerning for possible adhesion.  7 mm lingular nodule is noted. If the patient is at high risk for bronchogenic carcinoma, follow-up chest CT at 3-636months is recommended. If the patient is at low risk for bronchogenic carcinoma, follow-up chest CT at 6-12 months is recommended. Medical Consultants:   Surgery  Other Consultants:   Diabetic educator   Anti-Infectives:   None   Debbora PrestoMAGICK-Abilene Mcphee, MD  [facility] Pager [phone]  If 7PM-7AM, please contact night-coverage [email or web address] Password [email or web address] 09/06/2014, 2:15 PM   LOS: 1 day   HPI/Subjective: No events overnight.   Objective: Filed Vitals:   09/06/14 0412 09/06/14 0757 09/06/14 1100 09/06/14 1400  BP: 166/88   168/90  Pulse: 65   61  Temp: 98.2 F (36.8 C)   98.6 F (37 C)  TempSrc: Oral   Oral  Resp: 18   18  Height:  5\' 10"  (1.778 m)    Weight:   69.4 kg (153 lb)   SpO2: 100%   96%    Intake/Output Summary (Last 24 hours) at 09/06/14 1415 Last data filed at 09/06/14 0800  Gross per 24 hour  Intake      0 ml  Output    525 ml  Net   -525 ml    Exam:   General:  Pt is alert, follows commands appropriately, not in acute distress  Cardiovascular: Regular rate and rhythm, no rubs, no gallops  Respiratory: Clear to auscultation bilaterally, no wheezing, diminished breath sounds at bases   Abdomen: Soft, non tender, slightly distended, no guarding, NGT in place  l  Data Reviewed: Basic Metabolic Panel:  Recent Labs Lab 09/05/14 1650 09/06/14 0145  NA 140 140  K 3.1* 3.1*  CL 98 95*  CO2 30 27  GLUCOSE 214* 317*  BUN 8 11  CREATININE 1.04 1.03  CALCIUM 9.5  9.4   Liver Function Tests:  Recent Labs Lab 09/05/14 1528  AST 18  ALT 11  ALKPHOS 105  BILITOT 0.7  PROT 7.7  ALBUMIN 3.9    Recent Labs Lab 09/05/14 1528  LIPASE 31   CBC:  Recent Labs Lab 09/05/14 1650 09/06/14 0145  WBC 5.9 9.0  HGB 15.7 15.7  HCT 44.5 45.0  MCV 84.3 86.2  PLT 152 157   CBG:  Recent Labs Lab 09/06/14 0119 09/06/14 0409 09/06/14 0806 09/06/14 1157  GLUCAP 278* 302* 232* 206*    Scheduled Meds: . amLODipine  5 mg Oral Daily  . insulin aspart  0-9 Units Subcutaneous 6 times per day  . latanoprost  1 drop Both Eyes QHS  . lisinopril  40 mg Oral Daily  . metoprolol succinate  25 mg Oral Daily  . tiotropium  18 mcg Inhalation Daily   Continuous Infusions: . sodium chloride 75 mL/hr (09/06/14 1343)

## 2014-09-06 NOTE — Care Management Note (Signed)
    Page 1 of 1   09/16/2014     5:15:21 PM CARE MANAGEMENT NOTE 09/16/2014  Patient:  Jim Shields,Jim Shields   Account Number:  [id number]  Date Initiated:  09/06/2014  Documentation initiated by:  Takenya Travaglini  Subjective/Objective Assessment:   Pt adm on 09/05/14 with SBO.  PTA, pt resided at home with spouse.     Action/Plan:   Will follow for dc needs as pt progresses.   Anticipated DC Date:  09/16/2014   Anticipated DC Plan:  SKILLED NURSING FACILITY  In-house referral  Clinical Social Worker      DC Planning Services  CM consult      Choice offered to / List presented to:             Status of service:  In process, will continue to follow Medicare Important Message given?  YES (If response is "NO", the following Medicare IM given date fields will be blank) Date Medicare IM given:  09/08/2014 Medicare IM given by:  Ashtyn Meland Date Additional Medicare IM given:  09/15/2014 Additional Medicare IM given by:  Jozalynn Noyce  Discharge Disposition:  SKILLED NURSING FACILITY  Per UR Regulation:  Reviewed for med. necessity/level of care/duration of stay  If discussed at Long Length of Stay Meetings, dates discussed:   09/13/2014  09/15/2014    Comments:  09/16/14 Sidney AceJulie Henley Blyth, RN, BSN [phone] Pt discharging to SNF today, per CSW arrangements.  09/13/14 Sidney AceJulie Swannie Milius, RN, BSN [phone] Wife has called and expressed that she thinks pt needs rehab prior to dc home.  CSW consulted to facilitate poss dc to SNF wehen medically stable for dc.  Will follow progress.  09/09/14 Sidney AceJulie Zylan Almquist Rn, BSN [phone] Pt to OR today ; may possibly need SNF, as is deconditioned and weak.  Will follow post op.

## 2014-09-06 NOTE — ED Provider Notes (Signed)
Medical screening examination/treatment/procedure(s) were conducted as a shared visit with non-physician practitioner(s) and myself.  I personally evaluated the patient during the encounter.   EKG Interpretation   Date/Time:  Monday September 05 2014 16:48:51 EDT Ventricular Rate:  67 PR Interval:  152 QRS Duration: 96 QT Interval:  438 QTC Calculation: 462 R Axis:   69 Text Interpretation:  Sinus rhythm with Premature atrial complexes Left  ventricular hypertrophy with repolarization abnormality Abnormal ECG No  significant change since last tracing Confirmed by Mirian MoGentry, Matthew [phone]([id number])  on 09/05/2014 7:27:13 PM       Briefly, pt is a 80 y.o. male presenting with chest and abd pain in setting of HTN.  I performed an examination on the patient including cardiac, pulmonary, and gi systems which were remarkable for generalized abd tenderness.  Obtained ct dissection protocol which was unremarkable for disection but did demonstrate SBO.  Consulted surgery and medicine for admission.     Mirian MoMatthew Gentry, MD 09/06/14 [phone]

## 2014-09-06 NOTE — Progress Notes (Signed)
Utilization Review Completed.Shila Kruczek T10/27/2015  

## 2014-09-06 NOTE — Progress Notes (Signed)
Patient is okay.  Not much abdominal pain, but not flatus that he can report.  Will check films tomorrow AM.  [name], [name], MD, FACS [phone]--pager [phone]--office [facility]

## 2014-09-06 NOTE — Progress Notes (Signed)
Inpatient Diabetes Program Recommendations  AACE/ADA: New Consensus Statement on Inpatient Glycemic Control (2013)  Target Ranges:  Prepandial:   less than 140 mg/dL      Peak postprandial:   less than 180 mg/dL (1-2 hours)      Critically ill patients:  140 - 180 mg/dL   Reason for Assessment: Hyperglycemia  Diabetes history: Type 2 diabetes Outpatient Diabetes medications: Lantus 20 units every AM, Glucophage 1000 mg bid Current orders for Inpatient glycemic control: Novolog sensitive correction q 4 hours  Results for Jim Shields (MRN [id number]) as of 09/06/2014 12:14  Ref. Range 09/06/2014 01:19 09/06/2014 04:09 09/06/2014 08:06 09/06/2014 11:57  Glucose-Capillary Latest Range: 70-99 mg/dL 409278 (H) 811302 (H) 914232 (H) 206 (H)   Note:  Patient NPO.  Please consider adding Lantus 10 units daily.  Thank you.  Jaquay Morneault S. Elsie Lincolnouth, RN, CNS, CDE Inpatient Diabetes Program, team pager [phone]

## 2014-09-07 ENCOUNTER — Inpatient Hospital Stay (HOSPITAL_COMMUNITY): Payer: Medicare Other

## 2014-09-07 DIAGNOSIS — K5669 Other intestinal obstruction: Secondary | ICD-10-CM

## 2014-09-07 DIAGNOSIS — I10 Essential (primary) hypertension: Secondary | ICD-10-CM

## 2014-09-07 DIAGNOSIS — E876 Hypokalemia: Secondary | ICD-10-CM

## 2014-09-07 DIAGNOSIS — E119 Type 2 diabetes mellitus without complications: Secondary | ICD-10-CM

## 2014-09-07 LAB — GLUCOSE, CAPILLARY
GLUCOSE-CAPILLARY: 135 mg/dL — AB (ref 70–99)
GLUCOSE-CAPILLARY: 142 mg/dL — AB (ref 70–99)
GLUCOSE-CAPILLARY: 147 mg/dL — AB (ref 70–99)
Glucose-Capillary: 105 mg/dL — ABNORMAL HIGH (ref 70–99)
Glucose-Capillary: 114 mg/dL — ABNORMAL HIGH (ref 70–99)
Glucose-Capillary: 138 mg/dL — ABNORMAL HIGH (ref 70–99)
Glucose-Capillary: 152 mg/dL — ABNORMAL HIGH (ref 70–99)

## 2014-09-07 LAB — BASIC METABOLIC PANEL
ANION GAP: 12 (ref 5–15)
BUN: 20 mg/dL (ref 6–23)
CO2: 27 mEq/L (ref 19–32)
CREATININE: 1.22 mg/dL (ref 0.50–1.35)
Calcium: 9 mg/dL (ref 8.4–10.5)
Chloride: 106 mEq/L (ref 96–112)
GFR calc non Af Amer: 54 mL/min — ABNORMAL LOW (ref >90)
GFR, EST AFRICAN AMERICAN: 63 mL/min — AB (ref >90)
Glucose, Bld: 138 mg/dL — ABNORMAL HIGH (ref 70–99)
Potassium: 3.4 mEq/L — ABNORMAL LOW (ref 3.7–5.3)
SODIUM: 145 meq/L (ref 137–147)

## 2014-09-07 LAB — CBC
HCT: 46.8 % (ref 39.0–52.0)
Hemoglobin: 16.5 g/dL (ref 13.0–17.0)
MCH: 30.6 pg (ref 26.0–34.0)
MCHC: 35.3 g/dL (ref 30.0–36.0)
MCV: 86.8 fL (ref 78.0–100.0)
Platelets: 144 10*3/uL — ABNORMAL LOW (ref 150–400)
RBC: 5.39 MIL/uL (ref 4.22–5.81)
RDW: 13.9 % (ref 11.5–15.5)
WBC: 5.6 10*3/uL (ref 4.0–10.5)

## 2014-09-07 LAB — MAGNESIUM: MAGNESIUM: 2.1 mg/dL (ref 1.5–2.5)

## 2014-09-07 MED ORDER — POTASSIUM CHLORIDE 10 MEQ/100ML IV SOLN
10.0000 meq | INTRAVENOUS | Status: AC
  Administered 2014-09-07 (×3): 10 meq via INTRAVENOUS
  Filled 2014-09-07 (×3): qty 100

## 2014-09-07 MED ORDER — POTASSIUM CHLORIDE 10 MEQ/100ML IV SOLN
10.0000 meq | INTRAVENOUS | Status: DC
  Administered 2014-09-07: 10 meq via INTRAVENOUS
  Filled 2014-09-07 (×4): qty 100

## 2014-09-07 MED ORDER — ACETAMINOPHEN 650 MG RE SUPP
650.0000 mg | Freq: Three times a day (TID) | RECTAL | Status: DC | PRN
  Administered 2014-09-08 (×2): 650 mg via RECTAL
  Filled 2014-09-07 (×2): qty 1

## 2014-09-07 MED ORDER — DIPHENHYDRAMINE HCL 50 MG/ML IJ SOLN: 12.5000 mg | Freq: Once | INTRAMUSCULAR | Status: DC

## 2014-09-07 MED ORDER — METOPROLOL TARTRATE 1 MG/ML IV SOLN
5.0000 mg | Freq: Four times a day (QID) | INTRAVENOUS | Status: DC
  Administered 2014-09-07 – 2014-09-12 (×14): 5 mg via INTRAVENOUS
  Filled 2014-09-07 (×25): qty 5

## 2014-09-07 NOTE — Progress Notes (Signed)
Chart reviewed.  Patient ID: Freada BergeronWilliam Altieri, male   DOB: 1930/12/01, 80 y.o.   MRN: [id number] [facility] PROGRESS NOTE  Freada BergeronWilliam Mcduffie [id number] DOB: 1930/12/01 DOA: 09/05/2014 [facility]  Brief narrative: 80 yo male with COPD, HTN, presented with main concern of several days duration of progressively worsening generalized abd pain associated with nausea and non bloody vomiting, poor oral intake. CT abd c/q pSBO  Assessment and Plan:    Principal Problem:   SBO (small bowel obstruction) Denies flatus on stool. Continue NGT. 900 cc out yesterday    HTN (hypertension) Change po meds to iv while NPO    COPD (chronic obstructive pulmonary disease) Stable    7 mm lingular nodule - noted on CT chest - pt with history of tobacco use - recommendation is to have repeat CT chest at 3-6 months to ensure stability     DM type II - A1C 7.5 this admission Controlled    Hypokalemia Replete and check mag  Code Status: Full Family Communication: Pt and wife at bedside Disposition Plan:  Medical Consultants:   Surgery   09/07/2014, 2:38 PM   LOS: 2 days   HPI/Subjective: No flatus or stool no abd pain. Has hiccups.  Objective: Filed Vitals:   09/07/14 0028 09/07/14 0346 09/07/14 0836 09/07/14 1405  BP: 165/75 154/94  170/75  Pulse: 75 72  75  Temp:  98.2 F (36.8 C)  98.7 F (37.1 C)  TempSrc:  Oral  Oral  Resp:  18  18  Height:      Weight:  66.134 kg (145 lb 12.8 oz)    SpO2:  100% 97% 96%    Intake/Output Summary (Last 24 hours) at 09/07/14 1438 Last data filed at 09/07/14 0900  Gross per 24 hour  Intake      0 ml  Output   1000 ml  Net  -1000 ml    Exam:   General:  Asleep arousable oriented  HEENT: hearing aids. MMM. NG draining bilious.  Cardiovascular: Regular rate and rhythm, no rubs, no gallops  Respiratory: Clear to auscultation bilaterally, no wheezing, diminished breath sounds at bases   Abdomen:  Soft, non tender, slightly distended, no guarding, NGT in place  l  Data Reviewed: Basic Metabolic Panel:  Recent Labs Lab 09/05/14 1650 09/06/14 0145 09/07/14 0530  NA 140 140 145  K 3.1* 3.1* 3.4*  CL 98 95* 106  CO2 30 27 27   GLUCOSE 214* 317* 138*  BUN 8 11 20   CREATININE 1.04 1.03 1.22  CALCIUM 9.5 9.4 9.0   Liver Function Tests:  Recent Labs Lab 09/05/14 1528  AST 18  ALT 11  ALKPHOS 105  BILITOT 0.7  PROT 7.7  ALBUMIN 3.9    Recent Labs Lab 09/05/14 1528  LIPASE 31   CBC:  Recent Labs Lab 09/05/14 1650 09/06/14 0145 09/07/14 0530  WBC 5.9 9.0 5.6  HGB 15.7 15.7 16.5  HCT 44.5 45.0 46.8  MCV 84.3 86.2 86.8  PLT 152 157 144*   CBG:  Recent Labs Lab 09/06/14 2043 09/07/14 0035 09/07/14 0344 09/07/14 0804 09/07/14 1138  GLUCAP 139* 138* 147* 152* 105*    Scheduled Meds: . amLODipine  5 mg Oral Daily  . insulin aspart  0-9 Units Subcutaneous 6 times per day  . latanoprost  1 drop Both Eyes QHS  . lisinopril  40 mg Oral Daily  . metoprolol succinate  25 mg Oral Daily  . tiotropium  18 mcg Inhalation Daily   Continuous Infusions: . 0.9 % NaCl with KCl 40 mEq / L 75 mL/hr (09/06/14 1714)   Crista Curborinna Prim Morace, MD [facility] [phone]

## 2014-09-07 NOTE — Progress Notes (Signed)
Patient ID: Jim BergeronWilliam Shields, male   DOB: 11/11/1931, 80 y.o.   MRN: [id number]    Subjective: Pt feels ok as far as I can tell.  Denies BM or flatus per family member.  He is not a good historian, unsure whether he can't hear me even though he has his hearing aids in place or what.  Objective: Vital signs in last 24 hours: Temp:  [98.2 F (36.8 C)-98.8 F (37.1 C)] 98.2 F (36.8 C) (10/28 0346) Pulse Rate:  [61-75] 72 (10/28 0346) Resp:  [18] 18 (10/28 0346) BP: (154-183)/(75-94) 154/94 mmHg (10/28 0346) SpO2:  [96 %-100 %] 100 % (10/28 0346) Weight:  [145 lb 12.8 oz (66.134 kg)-153 lb (69.4 kg)] 145 lb 12.8 oz (66.134 kg) (10/28 0346) Last BM Date: 09/03/14  Intake/Output from previous day: 10/27 0701 - 10/28 0700 In: 0  Out: 1350 [Urine:450; Emesis/NG output:900] Intake/Output this shift:    PE: Abd: soft, still mild tenderness diffusely, +BS, minimal distention  Lab Results:   Recent Labs  09/06/14 0145 09/07/14 0530  WBC 9.0 5.6  HGB 15.7 16.5  HCT 45.0 46.8  PLT 157 144*   BMET  Recent Labs  09/06/14 0145 09/07/14 0530  NA 140 145  K 3.1* 3.4*  CL 95* 106  CO2 27 27  GLUCOSE 317* 138*  BUN 11 20  CREATININE 1.03 1.22  CALCIUM 9.4 9.0   PT/INR No results found for this basename: LABPROT, INR,  in the last 72 hours CMP     Component Value Date/Time   NA 145 09/07/2014 0530   K 3.4* 09/07/2014 0530   CL 106 09/07/2014 0530   CO2 27 09/07/2014 0530   GLUCOSE 138* 09/07/2014 0530   BUN 20 09/07/2014 0530   CREATININE 1.22 09/07/2014 0530   CALCIUM 9.0 09/07/2014 0530   PROT 7.7 09/05/2014 1528   ALBUMIN 3.9 09/05/2014 1528   AST 18 09/05/2014 1528   ALT 11 09/05/2014 1528   ALKPHOS 105 09/05/2014 1528   BILITOT 0.7 09/05/2014 1528   GFRNONAA 54* 09/07/2014 0530   GFRAA 63* 09/07/2014 0530   Lipase     Component Value Date/Time   LIPASE 31 09/05/2014 1528       Studies/Results: Dg Chest 2 View  09/05/2014   CLINICAL DATA:  Chest  pain, history of COPD  EXAM: CHEST  2 VIEW  COMPARISON:  01/15/2012  FINDINGS: Heart size upper normal to mildly enlarged. Mild interstitial coarsening. Mild right greater than left lung base opacity. Mild scattered aortic atherosclerosis. No definite pleural effusion. No pneumothorax. Mild multilevel degenerative changes. Surgical clips project over the upper abdomen.  IMPRESSION: Mild lung base opacity; atelectasis versus infiltrate.  Interstitial coarsening/peribronchial thickening may reflect COPD with acute and/or chronic bronchitis.   Electronically Signed   By: [name]  DelGaizo M.D.   On: 09/05/2014 21:30   Ct Angio Chest Aorta W/cm &/or Wo/cm  09/05/2014   CLINICAL DATA:  Acute chest and abdominal pain.  EXAM: CT ANGIOGRAPHY CHEST, ABDOMEN AND PELVIS  TECHNIQUE: Multidetector CT imaging through the chest, abdomen and pelvis was performed using the standard protocol during bolus administration of intravenous contrast. Multiplanar reconstructed images and MIPs were obtained and reviewed to evaluate the vascular anatomy.  CONTRAST:  100 mL of Omnipaque 350 intravenously.  COMPARISON:  CT scan of May 15, 2012.  FINDINGS: CTA CHEST FINDINGS  Mild bilateral apical scarring is noted. No pneumothorax or pleural effusion is noted. Mild emphysematous changes noted in the upper lobes bilaterally.  There is no evidence of thoracic aortic dissection or aneurysm. There is no evidence of pulmonary embolus. Calcified bilateral hilar lymph nodes are noted. No other mediastinal mass or adenopathy is noted. 7 mm nodule is noted inferiorly in the left lingula. Great vessels are widely patent.  Review of the MIP images confirms the above findings.  CTA ABDOMEN AND PELVIS FINDINGS  There is no evidence of abdominal aortic aneurysm or dissection. The renal arteries are widely patent without significant stenosis. The celiac and superior mesenteric arteries are also widely patent without significant stenosis. 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 Mild atherosclerotic calcifications of the abdominal aorta and iliac arteries are noted. Status post cholecystectomy. Bilateral renal cysts are again noted and unchanged compared to prior exam. Small amount of free fluid is noted around the liver and spleen. No significant abnormality is noted in the liver, spleen or pancreas. The appendix appears normal. Urinary bladder appears normal. No significant adenopathy is noted. Moderate to severe proximal small bowel dilatation is noted with possible transition zone seen in the central portion of the abdomen anteriorly best seen on image number 178 of series 5. This is concerning for possible adhesion.  Review of the MIP images confirms the above findings.  IMPRESSION: No evidence of thoracic or abdominal aortic dissection or aneurysm.  No evidence of pulmonary embolus.  Moderate to severe proximal small bowel dilatation is noted with possible transition zone seen in the central portion of the abdomen. This is concerning for possible adhesion.  7 mm lingular nodule is noted. If the patient is at high risk for bronchogenic carcinoma, follow-up chest CT at 3-52months is recommended. If the patient is at low risk for bronchogenic carcinoma, follow-up chest CT at 6-12 months is recommended. This recommendation follows the consensus statement: Guidelines for Management of Small Pulmonary Nodules Detected on CT Scans: A Statement from the Fleischner Society as published in Radiology 2005; 237:395-400.   Electronically Signed   By: Roque Lias M.D.   On: 09/05/2014 22:31   Ct Angio Abd/pel W/ And/or W/o  09/05/2014   CLINICAL DATA:  Acute chest and abdominal pain.  EXAM: CT ANGIOGRAPHY CHEST, ABDOMEN AND PELVIS  TECHNIQUE: Multidetector CT imaging through the chest, abdomen and pelvis was performed using the standard protocol during bolus administration of intravenous contrast. Multiplanar reconstructed images  and MIPs were obtained and reviewed to evaluate the vascular anatomy.  CONTRAST:  100 mL of Omnipaque 350 intravenously.  COMPARISON:  CT scan of May 15, 2012.  FINDINGS: CTA CHEST FINDINGS  Mild bilateral apical scarring is noted. No pneumothorax or pleural effusion is noted. Mild emphysematous changes noted in the upper lobes bilaterally. There is no evidence of thoracic aortic dissection or aneurysm. There is no evidence of pulmonary embolus. Calcified bilateral hilar lymph nodes are noted. No other mediastinal mass or adenopathy is noted. 7 mm nodule is noted inferiorly in the left lingula. Great vessels are widely patent.  Review of the MIP images confirms the above findings.  CTA ABDOMEN AND PELVIS FINDINGS  There is no evidence of abdominal aortic aneurysm or dissection. The renal arteries are widely patent without significant stenosis. The celiac and superior mesenteric arteries are also widely patent without significant stenosis. There is moderate stenosis involving the origin of the inferior mesenteric artery. Mild atherosclerotic calcifications of the abdominal aorta and iliac arteries are noted. Status post cholecystectomy. Bilateral renal cysts are again noted and unchanged compared to prior exam. Small  amount of free fluid is noted around the liver and spleen. No significant abnormality is noted in the liver, spleen or pancreas. The appendix appears normal. Urinary bladder appears normal. No significant adenopathy is noted. Moderate to severe proximal small bowel dilatation is noted with possible transition zone seen in the central portion of the abdomen anteriorly best seen on image number 178 of series 5. This is concerning for possible adhesion.  Review of the MIP images confirms the above findings.  IMPRESSION: No evidence of thoracic or abdominal aortic dissection or aneurysm.  No evidence of pulmonary embolus.  Moderate to severe proximal small bowel dilatation is noted with possible transition  zone seen in the central portion of the abdomen. This is concerning for possible adhesion.  7 mm lingular nodule is noted. If the patient is at high risk for bronchogenic carcinoma, follow-up chest CT at 3-226months is recommended. If the patient is at low risk for bronchogenic carcinoma, follow-up chest CT at 6-12 months is recommended. This recommendation follows the consensus statement: Guidelines for Management of Small Pulmonary Nodules Detected on CT Scans: A Statement from the Fleischner Society as published in Radiology 2005; 237:395-400.   Electronically Signed   By: Roque LiasJames  Green M.D.   On: 09/05/2014 22:31    Anti-infectives: Anti-infectives   None       Assessment/Plan  1. SBO  Plan: 1.  Will cont NGT and conservative management for now.  Patient may have his NGT clamped so he can get up and mobilize.  Await abdominal films today.  LOS: 2 days    Jamison Soward E 09/07/2014, 8:12 AM Pager: [phone]

## 2014-09-07 NOTE — Progress Notes (Signed)
09/07/14 1000  Clinical Encounter Type  Visited With Patient and family together  Visit Type Initial;Social support  Spiritual Encounters  Spiritual Needs Emotional  Stress Factors  Family Stress Factors Financial concerns   Chaplain was referred to patient via spiritual care consult. Patient was in and out of sleep while chaplain was present. Chaplain primarily spoke with the patient's wife. Patient's wife explained that her and her husband are going through financial strains at the moment. [place] and noted [place] few contacts of support in [place]. Patient's wife asked for a food voucher. Chaplain consulted staff in the Spiritual Care Department about this request. Chaplain gave patient's wife a food voucher to get through lunch today but noted that we will not be able to provide any vouchers in the future due to a limited supply and a concern that we need to reserve these for instances where the need is more pressing. Patient's wife was understanding of this. Chaplain will continue to provide emotional and spiritual support for patient and patient's wife as needed. Tracie Lindbloom, Tommi EmeryBlake R, Chaplain 10:50 AM

## 2014-09-07 NOTE — Progress Notes (Signed)
RN paged this NP secondary to bloody drainage from NGT to LWS. Described as bright red and about 10cc. RN turned suction off at that point. Abd film shows correct positioning of NGT and continued SBO. Pt without n/v and VSS. Hgb 15. Will restart LWS and monitor. Asked RN to turn suction off and call should she see any further bleeding.

## 2014-09-07 NOTE — Progress Notes (Signed)
09/07/2014 9:17 PM The scan showed the NG tube was in the correct place. Dr. Craige CottaKirby said to turn the suction for the tube back on and if blood is seen again to turn off the suction and call her again. The suction is back on low-intermintent and the output is yellow bile now. Will continue to monitor. Jim Massonavidson, Jim Shields

## 2014-09-07 NOTE — Progress Notes (Signed)
It does not appear as though the patient got oral contrast for his CT done originally.  Films from today do not seem to show gas in the colon.  Consider repeating CT with water soluble contrast.  [name], [name], MD, FACS [phone]--pager [phone]--office [facility]

## 2014-09-07 NOTE — Progress Notes (Signed)
09/07/2014 8:21 PM Patient started having bright blood coming out of his NG tube. Vital signs were normal, patient was not having current abdominal pain and was not nauseous or vomiting. Dr. Craige CottaKirby was informed and ordered to have an xray of abdomen to check for placement of the NG tube and to keep suction off until results were seen. Will continue to monitor patient. Harriet Massonavidson, Tavis Kring E

## 2014-09-08 ENCOUNTER — Inpatient Hospital Stay (HOSPITAL_COMMUNITY): Payer: Medicare Other

## 2014-09-08 DIAGNOSIS — R58 Hemorrhage, not elsewhere classified: Secondary | ICD-10-CM

## 2014-09-08 LAB — BASIC METABOLIC PANEL
Anion gap: 15 (ref 5–15)
BUN: 24 mg/dL — AB (ref 6–23)
CALCIUM: 9.2 mg/dL (ref 8.4–10.5)
CO2: 27 mEq/L (ref 19–32)
Chloride: 109 mEq/L (ref 96–112)
Creatinine, Ser: 1.09 mg/dL (ref 0.50–1.35)
GFR calc Af Amer: 72 mL/min — ABNORMAL LOW (ref >90)
GFR, EST NON AFRICAN AMERICAN: 62 mL/min — AB (ref >90)
Glucose, Bld: 99 mg/dL (ref 70–99)
Potassium: 4.2 mEq/L (ref 3.7–5.3)
SODIUM: 151 meq/L — AB (ref 137–147)

## 2014-09-08 LAB — GLUCOSE, CAPILLARY
GLUCOSE-CAPILLARY: 109 mg/dL — AB (ref 70–99)
GLUCOSE-CAPILLARY: 128 mg/dL — AB (ref 70–99)
Glucose-Capillary: 97 mg/dL (ref 70–99)
Glucose-Capillary: 87 mg/dL (ref 70–99)
Glucose-Capillary: 98 mg/dL (ref 70–99)
Glucose-Capillary: 99 mg/dL (ref 70–99)

## 2014-09-08 LAB — HEMOGLOBIN AND HEMATOCRIT, BLOOD
HCT: 47.3 % (ref 39.0–52.0)
Hemoglobin: 16.2 g/dL (ref 13.0–17.0)

## 2014-09-08 MED ORDER — BISACODYL 10 MG RE SUPP
10.0000 mg | Freq: Once | RECTAL | Status: AC
  Administered 2014-09-08: 10 mg via RECTAL
  Filled 2014-09-08: qty 1

## 2014-09-08 MED ORDER — IOHEXOL 300 MG/ML  SOLN
25.0000 mL | INTRAMUSCULAR | Status: AC
  Administered 2014-09-08 (×2): 25 mL via ORAL

## 2014-09-08 MED ORDER — MAGNESIUM HYDROXIDE 400 MG/5ML PO SUSP: 30.0000 mL | Freq: Every day | ORAL | Status: DC | PRN

## 2014-09-08 MED ORDER — PANTOPRAZOLE SODIUM 40 MG IV SOLR
40.0000 mg | Freq: Two times a day (BID) | INTRAVENOUS | Status: DC
Start: 2014-09-08 — End: 2014-09-13
  Administered 2014-09-08 – 2014-09-13 (×11): 40 mg via INTRAVENOUS
  Filled 2014-09-08 (×12): qty 40

## 2014-09-08 MED ORDER — INSULIN GLARGINE 100 UNIT/ML ~~LOC~~ SOLN
5.0000 [IU] | Freq: Every day | SUBCUTANEOUS | Status: DC
  Administered 2014-09-08 – 2014-09-14 (×7): 5 [IU] via SUBCUTANEOUS
  Filled 2014-09-08 (×10): qty 0.05

## 2014-09-08 MED ORDER — SODIUM CHLORIDE 0.45 % IV SOLN
INTRAVENOUS | Status: DC
  Administered 2014-09-08 – 2014-09-09 (×2): via INTRAVENOUS

## 2014-09-08 NOTE — Progress Notes (Signed)
Obstruction does not appear to be getting better.  Will get CT with water soluble contract and then re-evaluate  [name], [name], MD, FACS [phone]--pager [phone]--office [facility]

## 2014-09-08 NOTE — Progress Notes (Signed)
Patient ID: Jim BergeronWilliam Perdue, male   DOB: 01-01-1931, 80 y.o.   MRN: [id number]    Subjective: Pt is tearful.  Complains of some abdominal pain.  Not passing flatus or stool.  Tearful because he wants to eat.  Objective: Vital signs in last 24 hours: Temp:  [97.9 F (36.6 C)-98.7 F (37.1 C)] 97.9 F (36.6 C) (10/28 1956) Pulse Rate:  [72-75] 72 (10/28 1956) Resp:  [18] 18 (10/28 1956) BP: (152-170)/(75-98) 152/98 mmHg (10/28 1956) SpO2:  [96 %-100 %] 100 % (10/28 1956) Last BM Date: 09/03/14  Intake/Output from previous day: 10/28 0701 - 10/29 0700 In: 0  Out: 1700 [Urine:1350; Emesis/NG output:350] Intake/Output this shift: Total I/O In: -  Out: 1200 [Emesis/NG output:1200]  PE: Abd: soft, but distended, few BS, some mild diffuse tenderness NGT with bilious output mixed with some blood, likely from NGT trauma.  Lab Results:   Recent Labs  09/06/14 0145 09/07/14 0530 09/08/14 0915  WBC 9.0 5.6  --   HGB 15.7 16.5 16.2  HCT 45.0 46.8 47.3  PLT 157 144*  --    BMET  Recent Labs  09/07/14 0530 09/08/14 0915  NA 145 151*  K 3.4* 4.2  CL 106 109  CO2 27 27  GLUCOSE 138* 99  BUN 20 24*  CREATININE 1.22 1.09  CALCIUM 9.0 9.2   PT/INR No results found for this basename: LABPROT, INR,  in the last 72 hours CMP     Component Value Date/Time   NA 151* 09/08/2014 0915   K 4.2 09/08/2014 0915   CL 109 09/08/2014 0915   CO2 27 09/08/2014 0915   GLUCOSE 99 09/08/2014 0915   BUN 24* 09/08/2014 0915   CREATININE 1.09 09/08/2014 0915   CALCIUM 9.2 09/08/2014 0915   PROT 7.7 09/05/2014 1528   ALBUMIN 3.9 09/05/2014 1528   AST 18 09/05/2014 1528   ALT 11 09/05/2014 1528   ALKPHOS 105 09/05/2014 1528   BILITOT 0.7 09/05/2014 1528   GFRNONAA 62* 09/08/2014 0915   GFRAA 72* 09/08/2014 0915   Lipase     Component Value Date/Time   LIPASE 31 09/05/2014 1528       Studies/Results: Dg Abd Acute W/chest  09/07/2014   CLINICAL DATA:  Bowel obstruction   EXAM: ACUTE ABDOMEN SERIES (ABDOMEN 2 VIEW & CHEST 1 VIEW)  COMPARISON:  09/05/2014  FINDINGS: Cardiac shadow is mildly enlarged. No focal infiltrate or sizable effusion is seen. A nasogastric catheter is noted coursing into the stomach.  Postsurgical changes are noted in the right upper quadrant. Multiple scattered loops of small bowel are again identified and stable. Degenerative changes of the lumbar spine are again seen.  IMPRESSION: Stable dilated small bowel.  Continued followup is recommended.   Electronically Signed   By: Alcide CleverMark  Lukens M.D.   On: 09/07/2014 10:26   Dg Abd Portable 1v  09/07/2014   CLINICAL DATA:  Blood and NG tube drainage.  Evaluate placement.  EXAM: PORTABLE ABDOMEN - 1 VIEW  COMPARISON:  09/07/2014  FINDINGS: NG tube tip is in the proximal stomach with the side port near the GE junction, in stable position since earlier. Heart is mildly enlarged. No confluent opacity in the visualized lower lungs. Prior cholecystectomy. Mild distention of abdominal small bowel loops concerning for small bowel obstruction, similar to prior study.  IMPRESSION: NG tube tip remains in the proximal stomach with the side port near the GE junction, unchanged. Stable prominence of upper abdominal small bowel loops.  Electronically Signed   By: [name]  Dover M.D.   On: 09/07/2014 20:37    Anti-infectives: Anti-infectives   None       Assessment/Plan  1. SBO Patient Active Problem List   Diagnosis Date Noted  . SBO (small bowel obstruction) 09/05/2014  . Abdominal pain, acute 09/05/2014  . Nausea & vomiting 09/05/2014  . Hypokalemia 10/27/2013  . COPD (chronic obstructive pulmonary disease) 01/18/2012  . Preventative health care 01/18/2012  . COPD exacerbation 01/13/2012  . HTN (hypertension) 01/13/2012  . Diabetes mellitus, type 2 01/13/2012  . Hyperlipidemia 01/13/2012   Plan: 1. Patient still remains with SBO.  Will repeat abdominal films today, but may consider CT with oral contrast  to better evaluate his obstruction.  Will also give a suppository as patient feels like he needs to have a BM. 2. Agree with BID protonix for what is likely NGT trauma.   LOS: 3 days    Sigfredo Schreier E 09/08/2014, 10:45 AM Pager: [phone]

## 2014-09-08 NOTE — Progress Notes (Signed)
Chart reviewed.  Patient ID: Jim BergeronWilliam Haydu, male   DOB: 13-Dec-1930, 80 y.o.   MRN: [id number] [facility] PROGRESS NOTE  Jim BergeronWilliam Guadalupe JYN:[id number] DOB: 13-Dec-1930 DOA: 09/05/2014 [facility]  Brief narrative: 80 yo male with COPD, HTN, presented with main concern of several days duration of progressively worsening generalized abd pain associated with nausea and non bloody vomiting, poor oral intake. CT abd c/q pSBO. Overnight, had blood in NG aspirate, now billious  Assessment and Plan:    Principal Problem:   SBO (small bowel obstruction) No stool. Possibly some flatus yesterday. No abd pain today. NG output 1200 over night. May need ex lap LOA.   Reported limited blood from NGT: no gross blood now. Vitals ok. Likely NG trauma. Check h/h and start empiric ppi. scds only for dvt prophylaxis.    HTN (hypertension) Change po meds to iv while NPO    COPD (chronic obstructive pulmonary disease) Stable    7 mm lingular nodule - noted on CT chest - pt with history of tobacco use - recommendation is to have repeat CT chest at 3-6 months to ensure stability     DM type II - A1C 7.5 this admission Controlled    Hypokalemia mag ok. Repeat pending.  Code Status: Full Family Communication: wife at bedside Disposition Plan:   Medical Consultants:   Surgery   09/08/2014, 9:21 AM   LOS: 3 days   HPI/Subjective: Per wife, patient reported flatus yesterday. No abd pain. No stool.  Objective: Filed Vitals:   09/07/14 0346 09/07/14 0836 09/07/14 1405 09/07/14 1956  BP: 154/94  170/75 152/98  Pulse: 72  75 72  Temp: 98.2 F (36.8 C)  98.7 F (37.1 C) 97.9 F (36.6 C)  TempSrc: Oral  Oral Oral  Resp: 18  18 18   Height:      Weight: 66.134 kg (145 lb 12.8 oz)     SpO2: 100% 97% 96% 100%    Intake/Output Summary (Last 24 hours) at 09/08/14 0921 Last data filed at 09/08/14 0752  Gross per 24 hour  Intake      0 ml  Output   2600  ml  Net  -2600 ml    Exam:   General:  Washing up at side of bed. comfortable  HEENT: hearing aids. MMM. NG draining bilious.  Cardiovascular: Regular rate and rhythm, no rubs, no gallops  Respiratory: Clear to auscultation bilaterally, no wheezing, diminished breath sounds at bases   Abdomen: Soft, non tender, slightly distended, no guarding. Decreased bowel sounds  Ext no cce  Skin: ichthyosis l  Data Reviewed: Basic Metabolic Panel:  Recent Labs Lab 09/05/14 1650 09/06/14 0145 09/07/14 0530 09/07/14 0535  NA 140 140 145  --   K 3.1* 3.1* 3.4*  --   CL 98 95* 106  --   CO2 30 27 27   --   GLUCOSE 214* 317* 138*  --   BUN 8 11 20   --   CREATININE 1.04 1.03 1.22  --   CALCIUM 9.5 9.4 9.0  --   MG  --   --   --  2.1   Liver Function Tests:  Recent Labs Lab 09/05/14 1528  AST 18  ALT 11  ALKPHOS 105  BILITOT 0.7  PROT 7.7  ALBUMIN 3.9    Recent Labs Lab 09/05/14 1528  LIPASE 31   CBC:  Recent Labs Lab 09/05/14 1650 09/06/14 0145 09/07/14 0530  WBC 5.9 9.0 5.6  HGB 15.7 15.7 16.5  HCT 44.5 45.0 46.8  MCV 84.3 86.2 86.8  PLT 152 157 144*   CBG:  Recent Labs Lab 09/07/14 1614 09/07/14 2012 09/07/14 2332 09/08/14 0409 09/08/14 0809  GLUCAP 114* 135* 142* 97 87    Scheduled Meds: . amLODipine  5 mg Oral Daily  . insulin aspart  0-9 Units Subcutaneous 6 times per day  . latanoprost  1 drop Both Eyes QHS  . lisinopril  40 mg Oral Daily  . metoprolol succinate  25 mg Oral Daily  . tiotropium  18 mcg Inhalation Daily   Continuous Infusions: . 0.9 % NaCl with KCl 40 mEq / L 75 mL/hr (09/08/14 0446)   Crista Curborinna Shaquita Fort, MD [facility] [phone]

## 2014-09-09 ENCOUNTER — Encounter (HOSPITAL_COMMUNITY): Payer: Medicare Other | Admitting: Anesthesiology

## 2014-09-09 ENCOUNTER — Encounter (HOSPITAL_COMMUNITY): Payer: Self-pay | Admitting: Radiology

## 2014-09-09 ENCOUNTER — Inpatient Hospital Stay (HOSPITAL_COMMUNITY): Payer: Medicare Other | Admitting: Anesthesiology

## 2014-09-09 ENCOUNTER — Inpatient Hospital Stay (HOSPITAL_COMMUNITY): Payer: Medicare Other

## 2014-09-09 ENCOUNTER — Encounter (HOSPITAL_COMMUNITY)
Admission: EM | Disposition: A | Discharge status: Skilled Nursing Facility | Payer: Self-pay | Source: Home / Self Care | Attending: Internal Medicine

## 2014-09-09 HISTORY — PX: LAPAROTOMY: SHX154

## 2014-09-09 HISTORY — PX: LYSIS OF ADHESION: SHX5961

## 2014-09-09 LAB — BASIC METABOLIC PANEL
ANION GAP: 16 — AB (ref 5–15)
BUN: 35 mg/dL — ABNORMAL HIGH (ref 6–23)
CALCIUM: 8.6 mg/dL (ref 8.4–10.5)
CHLORIDE: 105 meq/L (ref 96–112)
CO2: 27 mEq/L (ref 19–32)
CREATININE: 1.35 mg/dL (ref 0.50–1.35)
GFR calc Af Amer: 56 mL/min — ABNORMAL LOW (ref >90)
GFR calc non Af Amer: 48 mL/min — ABNORMAL LOW (ref >90)
Glucose, Bld: 112 mg/dL — ABNORMAL HIGH (ref 70–99)
Potassium: 3.3 mEq/L — ABNORMAL LOW (ref 3.7–5.3)
Sodium: 148 mEq/L — ABNORMAL HIGH (ref 137–147)

## 2014-09-09 LAB — GLUCOSE, CAPILLARY
GLUCOSE-CAPILLARY: 112 mg/dL — AB (ref 70–99)
GLUCOSE-CAPILLARY: 110 mg/dL — AB (ref 70–99)
GLUCOSE-CAPILLARY: 117 mg/dL — AB (ref 70–99)
Glucose-Capillary: 161 mg/dL — ABNORMAL HIGH (ref 70–99)

## 2014-09-09 LAB — CBC
HCT: 45.4 % (ref 39.0–52.0)
HEMOGLOBIN: 15.6 g/dL (ref 13.0–17.0)
MCH: 29.7 pg (ref 26.0–34.0)
MCHC: 34.4 g/dL (ref 30.0–36.0)
MCV: 86.5 fL (ref 78.0–100.0)
Platelets: 148 10*3/uL — ABNORMAL LOW (ref 150–400)
RBC: 5.25 MIL/uL (ref 4.22–5.81)
RDW: 14.2 % (ref 11.5–15.5)
WBC: 4 10*3/uL (ref 4.0–10.5)

## 2014-09-09 SURGERY — LAPAROTOMY, EXPLORATORY
Anesthesia: General | Site: Abdomen

## 2014-09-09 MED ORDER — ROCURONIUM BROMIDE 100 MG/10ML IV SOLN
INTRAVENOUS | Status: DC | PRN
Start: 2014-09-09 — End: 2014-09-09
  Administered 2014-09-09: 10 mg via INTRAVENOUS
  Administered 2014-09-09: 30 mg via INTRAVENOUS

## 2014-09-09 MED ORDER — GLYCOPYRROLATE 0.2 MG/ML IJ SOLN
INTRAMUSCULAR | Status: DC | PRN
  Administered 2014-09-09: 0.6 mg via INTRAVENOUS

## 2014-09-09 MED ORDER — NEOSTIGMINE METHYLSULFATE 10 MG/10ML IV SOLN
INTRAVENOUS | Status: DC | PRN
  Administered 2014-09-09: 5 mg via INTRAVENOUS

## 2014-09-09 MED ORDER — PROPOFOL 10 MG/ML IV BOLUS
INTRAVENOUS | Status: AC
  Filled 2014-09-09: qty 20

## 2014-09-09 MED ORDER — PROPOFOL 10 MG/ML IV BOLUS
INTRAVENOUS | Status: DC | PRN
  Administered 2014-09-09: 120 mg via INTRAVENOUS

## 2014-09-09 MED ORDER — FENTANYL CITRATE 0.05 MG/ML IJ SOLN
INTRAMUSCULAR | Status: AC
  Filled 2014-09-09: qty 5

## 2014-09-09 MED ORDER — GLYCOPYRROLATE 0.2 MG/ML IJ SOLN
INTRAMUSCULAR | Status: AC
Start: 2014-09-09 — End: 2014-09-09
  Filled 2014-09-09: qty 2

## 2014-09-09 MED ORDER — DEXTROSE 5 % IV SOLN
2.0000 g | INTRAVENOUS | Status: AC
  Administered 2014-09-09: 2 g via INTRAVENOUS
  Filled 2014-09-09: qty 2

## 2014-09-09 MED ORDER — ONDANSETRON HCL 4 MG/2ML IJ SOLN
INTRAMUSCULAR | Status: DC | PRN
  Administered 2014-09-09: 4 mg via INTRAVENOUS

## 2014-09-09 MED ORDER — MEPERIDINE HCL 25 MG/ML IJ SOLN: 6.2500 mg | INTRAMUSCULAR | Status: DC | PRN

## 2014-09-09 MED ORDER — HYDROMORPHONE HCL 1 MG/ML IJ SOLN
0.2500 mg | INTRAMUSCULAR | Status: DC | PRN
  Administered 2014-09-09 (×2): 1 mg via INTRAVENOUS

## 2014-09-09 MED ORDER — FENTANYL CITRATE 0.05 MG/ML IJ SOLN
INTRAMUSCULAR | Status: DC | PRN
  Administered 2014-09-09: 50 ug via INTRAVENOUS
  Administered 2014-09-09: 100 ug via INTRAVENOUS
  Administered 2014-09-09: 50 ug via INTRAVENOUS

## 2014-09-09 MED ORDER — ARTIFICIAL TEARS OP OINT
TOPICAL_OINTMENT | OPHTHALMIC | Status: DC | PRN
  Administered 2014-09-09: 1 via OPHTHALMIC

## 2014-09-09 MED ORDER — LACTATED RINGERS IV SOLN
INTRAVENOUS | Status: DC | PRN
  Administered 2014-09-09: 16:00:00 via INTRAVENOUS

## 2014-09-09 MED ORDER — ONDANSETRON HCL 4 MG/2ML IJ SOLN
INTRAMUSCULAR | Status: AC
  Filled 2014-09-09: qty 2

## 2014-09-09 MED ORDER — DEXTROSE 5 % IV SOLN
10.0000 mg | INTRAVENOUS | Status: DC | PRN
  Administered 2014-09-09: 25 ug/min via INTRAVENOUS

## 2014-09-09 MED ORDER — SODIUM CHLORIDE 0.45 % IV SOLN
INTRAVENOUS | Status: DC
  Administered 2014-09-09 – 2014-09-10 (×3): via INTRAVENOUS

## 2014-09-09 MED ORDER — FENTANYL CITRATE 0.05 MG/ML IJ SOLN: 25.0000 ug | INTRAMUSCULAR | Status: DC | PRN

## 2014-09-09 MED ORDER — HYDROMORPHONE HCL 1 MG/ML IJ SOLN
INTRAMUSCULAR | Status: AC
  Filled 2014-09-09: qty 1

## 2014-09-09 MED ORDER — PROMETHAZINE HCL 25 MG/ML IJ SOLN: 6.2500 mg | INTRAMUSCULAR | Status: DC | PRN

## 2014-09-09 MED ORDER — LACTATED RINGERS IV SOLN
INTRAVENOUS | Status: DC
  Administered 2014-09-09: 16:00:00 via INTRAVENOUS

## 2014-09-09 MED ORDER — 0.9 % SODIUM CHLORIDE (POUR BTL) OPTIME
TOPICAL | Status: DC | PRN
  Administered 2014-09-09: 1000 mL

## 2014-09-09 MED ORDER — SUCCINYLCHOLINE CHLORIDE 20 MG/ML IJ SOLN
INTRAMUSCULAR | Status: DC | PRN
  Administered 2014-09-09: 100 mg via INTRAVENOUS

## 2014-09-09 SURGICAL SUPPLY — 54 items
BLADE SURG ROTATE 9660 (MISCELLANEOUS) IMPLANT
BRR ADH 5X3 SEPRAFILM 6 SHT (MISCELLANEOUS)
CANISTER SUCTION 2500CC (MISCELLANEOUS) ×3 IMPLANT
CHLORAPREP W/TINT 26ML (MISCELLANEOUS) ×3 IMPLANT
COVER MAYO STAND STRL (DRAPES) IMPLANT
COVER SURGICAL LIGHT HANDLE (MISCELLANEOUS) ×3 IMPLANT
DRAPE LAPAROSCOPIC ABDOMINAL (DRAPES) ×3 IMPLANT
DRAPE PROXIMA HALF (DRAPES) IMPLANT
DRAPE UTILITY 15X26 W/TAPE STR (DRAPE) ×2 IMPLANT
DRAPE WARM FLUID 44X44 (DRAPE) ×3 IMPLANT
DRSG OPSITE POSTOP 4X10 (GAUZE/BANDAGES/DRESSINGS) IMPLANT
DRSG OPSITE POSTOP 4X8 (GAUZE/BANDAGES/DRESSINGS) IMPLANT
ELECT BLADE 6.5 EXT (BLADE) IMPLANT
ELECT CAUTERY BLADE 6.4 (BLADE) ×2 IMPLANT
ELECT REM PT RETURN 9FT ADLT (ELECTROSURGICAL) ×3
ELECTRODE REM PT RTRN 9FT ADLT (ELECTROSURGICAL) ×1 IMPLANT
GLOVE BIO SURGEON STRL SZ 6.5 (GLOVE) ×2 IMPLANT
GLOVE BIO SURGEONS STRL SZ 6.5 (GLOVE) ×2
GLOVE BIOGEL PI IND STRL 6 (GLOVE) IMPLANT
GLOVE BIOGEL PI IND STRL 6.5 (GLOVE) IMPLANT
GLOVE BIOGEL PI IND STRL 7.5 (GLOVE) IMPLANT
GLOVE BIOGEL PI IND STRL 8 (GLOVE) ×1 IMPLANT
GLOVE BIOGEL PI INDICATOR 6 (GLOVE) ×2
GLOVE BIOGEL PI INDICATOR 6.5 (GLOVE) ×2
GLOVE BIOGEL PI INDICATOR 7.5 (GLOVE) ×2
GLOVE BIOGEL PI INDICATOR 8 (GLOVE) ×2
GLOVE ECLIPSE 7.5 STRL STRAW (GLOVE) ×3 IMPLANT
GOWN STRL REUS W/ TWL LRG LVL3 (GOWN DISPOSABLE) ×3 IMPLANT
GOWN STRL REUS W/TWL LRG LVL3 (GOWN DISPOSABLE) ×6
KIT BASIN OR (CUSTOM PROCEDURE TRAY) ×3 IMPLANT
KIT ROOM TURNOVER OR (KITS) ×3 IMPLANT
LIGASURE IMPACT 36 18CM CVD LR (INSTRUMENTS) IMPLANT
NS IRRIG 1000ML POUR BTL (IV SOLUTION) ×4 IMPLANT
PACK GENERAL/GYN (CUSTOM PROCEDURE TRAY) ×3 IMPLANT
PAD ARMBOARD 7.5X6 YLW CONV (MISCELLANEOUS) ×3 IMPLANT
PENCIL BUTTON HOLSTER BLD 10FT (ELECTRODE) IMPLANT
SEPRAFILM PROCEDURAL PACK 3X5 (MISCELLANEOUS) IMPLANT
SPECIMEN JAR LARGE (MISCELLANEOUS) IMPLANT
SPONGE GAUZE 4X4 12PLY STER LF (GAUZE/BANDAGES/DRESSINGS) ×2 IMPLANT
SPONGE LAP 18X18 X RAY DECT (DISPOSABLE) IMPLANT
STAPLER VISISTAT 35W (STAPLE) ×5 IMPLANT
SUCTION POOLE TIP (SUCTIONS) ×3 IMPLANT
SUT NOVA 1 T20/GS 25DT (SUTURE) IMPLANT
SUT PDS AB 1 TP1 96 (SUTURE) ×8 IMPLANT
SUT SILK 2 0 SH CR/8 (SUTURE) ×1 IMPLANT
SUT SILK 2 0 TIES 10X30 (SUTURE) ×3 IMPLANT
SUT SILK 3 0 SH CR/8 (SUTURE) ×3 IMPLANT
SUT SILK 3 0 TIES 10X30 (SUTURE) ×3 IMPLANT
TAPE CLOTH SURG 4X10 WHT LF (GAUZE/BANDAGES/DRESSINGS) ×2 IMPLANT
TOWEL OR 17X26 10 PK STRL BLUE (TOWEL DISPOSABLE) ×3 IMPLANT
TRAY FOLEY CATH 16FRSI W/METER (SET/KITS/TRAYS/PACK) ×2 IMPLANT
TUBE CONNECTING 12'X1/4 (SUCTIONS)
TUBE CONNECTING 12X1/4 (SUCTIONS) IMPLANT
YANKAUER SUCT BULB TIP NO VENT (SUCTIONS) IMPLANT

## 2014-09-09 NOTE — Progress Notes (Signed)
CCS/Jim Shields Progress Note    Subjective: Patient is completely obstructed.  Will need surgery.    Objective: Vital signs in last 24 hours: Temp:  [97.5 F (36.4 C)-97.9 F (36.6 C)] 97.7 F (36.5 C) (10/30 0500) Pulse Rate:  [58-77] 63 (10/30 0500) Resp:  [17-20] 18 (10/30 0500) BP: (134-180)/(85-97) 134/85 mmHg (10/30 0500) SpO2:  [96 %-100 %] 98 % (10/30 0745) Last BM Date: 09/08/14  Intake/Output from previous day: 10/29 0701 - 10/30 0700 In: 0  Out: 1950 [Urine:400; Emesis/NG output:1550] Intake/Output this shift:    General: No distress.  X-rays complete and show no passage of contrast into the distal bowel.  Lungs: Clear  Abd: distended, no bowel sounds.  Not tender.  Extremities: No changes  Neuro: Intact  Lab Results:  @LABLAST2 (wbc:2,hgb:2,hct:2,plt:2) BMET  Recent Labs  09/07/14 0530 09/08/14 0915  NA 145 151*  K 3.4* 4.2  CL 106 109  CO2 27 27  GLUCOSE 138* 99  BUN 20 24*  CREATININE 1.22 1.09  CALCIUM 9.0 9.2   PT/INR No results found for this basename: LABPROT, INR,  in the last 72 hours ABG No results found for this basename: PHART, PCO2, PO2, HCO3,  in the last 72 hours  Studies/Results: Ct Abdomen Pelvis Wo Contrast  09/09/2014   CLINICAL DATA:  Abdominal pain, nausea, vomiting.  EXAM: CT ABDOMEN AND PELVIS WITHOUT CONTRAST  TECHNIQUE: Multidetector CT imaging of the abdomen and pelvis was performed following the standard protocol without IV contrast.  COMPARISON:  09/05/2014  FINDINGS: Upper normal to mildly enlarged heart size. Coronary artery calcifications. Bibasilar atelectasis and/or scarring. NG tube descends into the proximal stomach.  Organ evaluation is limited in the absence of intravenous contrast. Within this limitation, hepatic and splenic granuloma. A peripherally enhancing liver lesion on the recent prior is not visualized without contrast. The enhancement characteristics favors that of hemangioma. Slight central biliary ductal  dilatation status post cholecystectomy. Lipoma along the pancreatic head. No adrenal lesion. Bilateral renal cysts. No hydroureteronephrosis.  Decompressed colon and distal small bowel loops. Normal appendix. Distention of proximal and mid small bowel loops up to 4 cm. Focal transition point along a kinked loop in the right hemi-abdomen (series 2, image 51).  Small amount of perihepatic and interloop fluid. No bowel wall pneumatosis. No free intraperitoneal air.  Scattered atherosclerotic disease of the aorta and branch vessels without aneurysmal dilatation.  Thin walled bladder.  Upper normal size prostate gland.  Left SI joint ankylosis.  Multilevel degenerative changes.  IMPRESSION: Small bowel obstruction has progressed, now high grade. Right hemi abdomen transition point, favor an underlying adhesion. Small amount of interloop fluid.   Electronically Signed   By: [name]  DelGaizo M.D.   On: 09/09/2014 06:49   Dg Abd 2 Views  09/09/2014   CLINICAL DATA:  Small bowel obstruction.  Abdominal distention.  EXAM: ABDOMEN - 2 VIEW  COMPARISON:  CT abdomen and pelvis 09/09/2014 and abdominal radiographs 09/08/2014  FINDINGS: No definite intraperitoneal free air is identified. Multiple gas-filled loops of dilated small bowel are again seen in the mid to upper abdomen, measuring up to 5.3 cm in diameter, similar to prior radiographs. Multiple fluid-filled small bowel loops are present lower in the abdomen. Multiple small bowel air-fluid levels are seen. There is a paucity of colonic gas. Enteric tube remains in place with tip projecting over the proximal stomach. Multiple surgical clips are again seen in the right upper quadrant. Thoracolumbar spondylosis is noted.  IMPRESSION: Persistent high-grade small bowel  obstruction.   Electronically Signed   By: [name]  Grady   On: 09/09/2014 08:18   Dg Abd 2 Views  09/08/2014   CLINICAL DATA:  Small bowel obstruction  EXAM: ABDOMEN - 2 VIEW  COMPARISON:  09/07/2014   FINDINGS: NG tube tip in the fundus of the stomach. Dilated small bowel loops with air-fluid levels are stable. There is no free intraperitoneal gas. Postoperative changes are noted.  IMPRESSION: Stable partial small bowel obstruction pattern. No free intraperitoneal gas.   Electronically Signed   By: [name]  Hoss M.D.   On: 09/08/2014 12:05   Dg Abd Portable 1v  09/07/2014   CLINICAL DATA:  Blood and NG tube drainage.  Evaluate placement.  EXAM: PORTABLE ABDOMEN - 1 VIEW  COMPARISON:  09/07/2014  FINDINGS: NG tube tip is in the proximal stomach with the side port near the GE junction, in stable position since earlier. Heart is mildly enlarged. No confluent opacity in the visualized lower lungs. Prior cholecystectomy. Mild distention of abdominal small bowel loops concerning for small bowel obstruction, similar to prior study.  IMPRESSION: NG tube tip remains in the proximal stomach with the side port near the GE junction, unchanged. Stable prominence of upper abdominal small bowel loops.   Electronically Signed   By: [name]  Dover M.D.   On: 09/07/2014 20:37    Anti-infectives: Anti-infectives   None      Assessment/Plan: s/p  Needs surgery and will go ahead and schedule.  LOS: 4 days   Jim LamasJames O. Gae BonWyatt, [name], MD, FACS [phone]--pager [phone]--office [facility] 09/09/2014

## 2014-09-09 NOTE — Anesthesia Procedure Notes (Signed)
Procedure Name: Intubation Date/Time: 09/09/2014 4:56 PM Performed by: Brien MatesMAHONY, Kenechukwu Eckstein D Pre-anesthesia Checklist: Patient identified, Emergency Drugs available, Suction available, Patient being monitored and Timeout performed Patient Re-evaluated:Patient Re-evaluated prior to inductionOxygen Delivery Method: Circle system utilized Preoxygenation: Pre-oxygenation with 100% oxygen Intubation Type: IV induction, Rapid sequence and Cricoid Pressure applied Laryngoscope Size: Miller and 2 Tube type: Oral Tube size: 7.5 mm Number of attempts: 1 Airway Equipment and Method: Stylet Placement Confirmation: ETT inserted through vocal cords under direct vision,  positive ETCO2 and breath sounds checked- equal and bilateral Secured at: 23 cm Tube secured with: Tape Dental Injury: Teeth and Oropharynx as per pre-operative assessment

## 2014-09-09 NOTE — Transfer of Care (Signed)
Immediate Anesthesia Transfer of Care Note  Patient: Jim BergeronWilliam Shields  Procedure(s) Performed: Procedure(s): EXPLORATORY LAPAROTOMY (N/A) LYSIS OF ADHESION (N/A)  Patient Location: PACU  Anesthesia Type:General  Level of Consciousness: awake  Airway & Oxygen Therapy: Patient Spontanous Breathing and Patient connected to face mask oxygen  Post-op Assessment: Report given to PACU RN and Post -op Vital signs reviewed and stable  Post vital signs: Reviewed and stable  Complications: No apparent anesthesia complications

## 2014-09-09 NOTE — Progress Notes (Signed)
Patient ID: Jim BergeronWilliam Shields, male   DOB: 07-24-1931, 80 y.o.   MRN: [id number] [facility] PROGRESS NOTE  Jim BergeronWilliam Shields [id number] DOB: 07-24-1931 DOA: 09/05/2014 [facility]  Brief narrative: 80 yo male with COPD, HTN, presented with main concern of several days duration of progressively worsening generalized abd pain associated with nausea and non bloody vomiting, poor oral intake. CT abd c/q pSBO.   Assessment and Plan:    Principal Problem:   SBO (small bowel obstruction) Repeat CT shows high grade obstruction. To OR today for ex lap, loa  Reported limited blood from NGT 10/28: no gross blood now. Vitals ok. Likely NG trauma.empiric ppi. scds only for dvt prophylaxis.    HTN (hypertension) Change po meds to iv while NPO    COPD (chronic obstructive pulmonary disease) Stable    7 mm lingular nodule - noted on CT chest - pt with history of tobacco use - recommendation is to have repeat CT chest at 3-6 months to ensure stability     DM type II - A1C 7.5 this admission Controlled    Hypokalemia mag ok. Correct IV  Hypernatremia: on D5W  Code Status: Full Family Communication: wife at bedside Disposition Plan:   Medical Consultants:   Surgery   09/09/2014, 6:43 PM   LOS: 4 days   HPI/Subjective: No stool no flatus  Objective: Filed Vitals:   09/09/14 1800 09/09/14 1815 09/09/14 1823 09/09/14 1830  BP:  157/60 131/49   Pulse: 105 72 73   Temp:   97.7 F (36.5 C)   TempSrc:      Resp: 19 14 12 10   Height:      Weight:      SpO2: 59% 97% 96%     Intake/Output Summary (Last 24 hours) at 09/09/14 1843 Last data filed at 09/09/14 1835  Gross per 24 hour  Intake   1200 ml  Output   2825 ml  Net  -1625 ml    Exam:   General:  Asleep arousable  HEENT: hearing aids. MMM. NG draining bilious.  Cardiovascular: Regular rate and rhythm, no rubs, no gallops  Respiratory: Clear to auscultation bilaterally,  no wheezing, diminished breath sounds at bases   Abdomen: Soft, non tender, slightly distended, no guarding. Decreased bowel sounds  Ext no cce  Skin: ichthyosis l  Data Reviewed: Basic Metabolic Panel:  Recent Labs Lab 09/05/14 1650 09/06/14 0145 09/07/14 0530 09/07/14 0535 09/08/14 0915 09/09/14 0923  NA 140 140 145  --  151* 148*  K 3.1* 3.1* 3.4*  --  4.2 3.3*  CL 98 95* 106  --  109 105  CO2 30 27 27   --  27 27  GLUCOSE 214* 317* 138*  --  99 112*  BUN 8 11 20   --  24* 35*  CREATININE 1.04 1.03 1.22  --  1.09 1.35  CALCIUM 9.5 9.4 9.0  --  9.2 8.6  MG  --   --   --  2.1  --   --    Liver Function Tests:  Recent Labs Lab 09/05/14 1528  AST 18  ALT 11  ALKPHOS 105  BILITOT 0.7  PROT 7.7  ALBUMIN 3.9    Recent Labs Lab 09/05/14 1528  LIPASE 31   CBC:  Recent Labs Lab 09/05/14 1650 09/06/14 0145 09/07/14 0530 09/08/14 0915 09/09/14 0923  WBC 5.9 9.0 5.6  --  4.0  HGB 15.7 15.7 16.5 16.2 15.6  HCT 44.5  45.0 46.8 47.3 45.4  MCV 84.3 86.2 86.8  --  86.5  PLT 152 157 144*  --  148*   CBG:  Recent Labs Lab 09/08/14 1950 09/08/14 2356 09/09/14 0400 09/09/14 0805 09/09/14 1145  GLUCAP 128* 109* 112* 110* 117*    Scheduled Meds: . amLODipine  5 mg Oral Daily  . insulin aspart  0-9 Units Subcutaneous 6 times per day  . latanoprost  1 drop Both Eyes QHS  . lisinopril  40 mg Oral Daily  . metoprolol succinate  25 mg Oral Daily  . tiotropium  18 mcg Inhalation Daily   Continuous Infusions: . sodium chloride 100 mL/hr at 09/09/14 0118  . lactated ringers 10 mL/hr at 09/09/14 1558   Crista Curborinna Jawan Chavarria, MD [facility] [phone]

## 2014-09-09 NOTE — Anesthesia Preprocedure Evaluation (Addendum)
Anesthesia Evaluation  Patient identified by MRN, date of birth, ID band Patient awake    Reviewed: Allergy & Precautions, H&P , NPO status , Patient's Chart, lab work & pertinent test results  Airway Mallampati: II  TM Distance: >3 FB Neck ROM: Full    Dental  (+) Edentulous Upper, Edentulous Lower   Pulmonary COPD COPD inhaler, former smoker,          Cardiovascular hypertension, Pt. on medications     Neuro/Psych    GI/Hepatic   Endo/Other  diabetes, Poorly Controlled, Type 2, Insulin Dependent  Renal/GU      Musculoskeletal   Abdominal   Peds  Hematology   Anesthesia Other Findings   Reproductive/Obstetrics                         Anesthesia Physical Anesthesia Plan  ASA: [name] and emergent  Anesthesia Plan: General   Post-op Pain Management:    Induction: Intravenous, Rapid sequence and Cricoid pressure planned  Airway Management Planned: Oral ETT  Additional Equipment:   Intra-op Plan:   Post-operative Plan: Extubation in OR  Informed Consent: I have reviewed the patients History and Physical, chart, labs and discussed the procedure including the risks, benefits and alternatives for the proposed anesthesia with the patient or authorized representative who has indicated his/her understanding and acceptance.   Dental advisory given  Plan Discussed with: CRNA, Anesthesiologist and Surgeon  Anesthesia Plan Comments:      Anesthesia Quick Evaluation

## 2014-09-09 NOTE — Progress Notes (Signed)
Updated dr r fitzgerald on sats/NGT drainage/pain under control and on room air.

## 2014-09-09 NOTE — Anesthesia Postprocedure Evaluation (Signed)
  Anesthesia Post-op Note  Patient: Freada BergeronWilliam Vanvranken  Procedure(s) Performed: Procedure(s): EXPLORATORY LAPAROTOMY (N/A) LYSIS OF ADHESION (N/A)  Patient Location: PACU  Anesthesia Type:General  Level of Consciousness: awake, alert , oriented, patient cooperative and responds to stimulation  Airway and Oxygen Therapy: Patient Spontanous Breathing  Post-op Pain: 2 /10, mild  Post-op Assessment: Post-op Vital signs reviewed, Patient's Cardiovascular Status Stable, Respiratory Function Stable, Patent Airway, No signs of Nausea or vomiting and Pain level controlled  Post-op Vital Signs: Reviewed and stable  Last Vitals:  Filed Vitals:   09/09/14 1830  BP:   Pulse:   Temp:   Resp: 10    Complications: No apparent anesthesia complications

## 2014-09-09 NOTE — Op Note (Signed)
OPERATIVE REPORT  DATE OF OPERATION: 09/05/2014 - 09/09/2014  PATIENT:  Jim Shields  80 y.o. male  PRE-OPERATIVE DIAGNOSIS:  Bowel obstruction  POST-OPERATIVE DIAGNOSIS:  Bowel obstruction  PROCEDURE:  Procedure(s): EXPLORATORY LAPAROTOMY LYSIS OF ADHESION  SURGEON:  Surgeon(s): Frederik SchmidtJay Fenix Ruppe, MD  ASSISTANT: None  ANESTHESIA:   general  EBL: <50 ml  BLOOD ADMINISTERED: none  DRAINS: Nasogastric Tube and Urinary Catheter (Foley)   SPECIMEN:  No Specimen  COUNTS CORRECT:  YES  PROCEDURE DETAILS: The patient was taken to the operating room and placed on the table in the supine position. After an adequate general endotracheal anesthetic was administered she was prepped and draped in the usual sterile manner.  The patient was brought to the operating room for a small bowel obstruction. After proper timeout was performed identifying the patient and the procedure to be performed a midline incision was made using a #10 blade from approximately 8 cm above the umbilicus to 3 cm below. Was taken down to and through the midline fascia carefully and without injury using electrocautery.  Immediately a small amount to a moderate amount of nonbilious ascites was aspirated from the peritoneal cavity. Dilated loops of small bowel were in the center of the abdomen. There was a very taut adhesive band extending from the anterior abdominal wall posteriorly around which a loop of small bowel was tethered and obstructive.  This adhesive band was cut and then we subsequently ran the small bowel proximally. It continued to be dilated and a loop coursed up towards the gallbladder fossa with the patient had had a previous open cholecystectomy. There were dense adhesions of a loop of small bowel/jejunum to the gallbladder fossa partially obstructing this also. Care was taken releasing this loop of small bowel from the gallbladder fossa without injury. We were subsequently able to clear all the adhesions  and get a clear track of small bowel from the ligament of Treitz down to the terminal ileum.  Liquid contents in the small bowel were milked back through the pylorus into the stomach and aspirated by the anesthesia staff through the NGT in the stomach. We irrigated with a small amount of saline for this class I wound. We then closed the fascia using running looped #1 PDS. The skin was closed using stainless steel staples. All needle counts, sponge counts, and instrument counts were correct.  PATIENT DISPOSITION:  PACU - hemodynamically stable.   Taquisha Phung, JAY 10/30/20155:44 PM

## 2014-09-10 DIAGNOSIS — R100 Acute abdomen: Secondary | ICD-10-CM

## 2014-09-10 DIAGNOSIS — E870 Hyperosmolality and hypernatremia: Secondary | ICD-10-CM

## 2014-09-10 LAB — CBC
HCT: 42.7 % (ref 39.0–52.0)
HEMOGLOBIN: 14.7 g/dL (ref 13.0–17.0)
MCH: 29.9 pg (ref 26.0–34.0)
MCHC: 34.4 g/dL (ref 30.0–36.0)
MCV: 86.8 fL (ref 78.0–100.0)
Platelets: 128 10*3/uL — ABNORMAL LOW (ref 150–400)
RBC: 4.92 MIL/uL (ref 4.22–5.81)
RDW: 14.2 % (ref 11.5–15.5)
WBC: 6.1 10*3/uL (ref 4.0–10.5)

## 2014-09-10 LAB — GLUCOSE, CAPILLARY
GLUCOSE-CAPILLARY: 117 mg/dL — AB (ref 70–99)
GLUCOSE-CAPILLARY: 153 mg/dL — AB (ref 70–99)
Glucose-Capillary: 108 mg/dL — ABNORMAL HIGH (ref 70–99)
Glucose-Capillary: 115 mg/dL — ABNORMAL HIGH (ref 70–99)
Glucose-Capillary: 117 mg/dL — ABNORMAL HIGH (ref 70–99)
Glucose-Capillary: 128 mg/dL — ABNORMAL HIGH (ref 70–99)

## 2014-09-10 LAB — BASIC METABOLIC PANEL
Anion gap: 17 — ABNORMAL HIGH (ref 5–15)
BUN: 41 mg/dL — ABNORMAL HIGH (ref 6–23)
CHLORIDE: 103 meq/L (ref 96–112)
CO2: 23 mEq/L (ref 19–32)
Calcium: 7.5 mg/dL — ABNORMAL LOW (ref 8.4–10.5)
Creatinine, Ser: 1.75 mg/dL — ABNORMAL HIGH (ref 0.50–1.35)
GFR, EST AFRICAN AMERICAN: 41 mL/min — AB (ref >90)
GFR, EST NON AFRICAN AMERICAN: 35 mL/min — AB (ref >90)
Glucose, Bld: 117 mg/dL — ABNORMAL HIGH (ref 70–99)
POTASSIUM: 3.4 meq/L — AB (ref 3.7–5.3)
SODIUM: 143 meq/L (ref 137–147)

## 2014-09-10 MED ORDER — POTASSIUM CHLORIDE 10 MEQ/100ML IV SOLN
10.0000 meq | INTRAVENOUS | Status: AC
  Administered 2014-09-10 (×3): 10 meq via INTRAVENOUS
  Filled 2014-09-10 (×4): qty 100

## 2014-09-10 MED ORDER — PNEUMOCOCCAL VAC POLYVALENT 25 MCG/0.5ML IJ INJ
0.5000 mL | INJECTION | INTRAMUSCULAR | Status: DC
  Filled 2014-09-10 (×2): qty 0.5

## 2014-09-10 MED ORDER — SODIUM CHLORIDE 0.9 % IV SOLN
500.0000 mL | Freq: Once | INTRAVENOUS | Status: AC
  Administered 2014-09-10: 500 mL via INTRAVENOUS

## 2014-09-10 NOTE — Progress Notes (Signed)
Patient ID: Jim Shields, male   DOB: 1931-08-15, 80 y.o.   MRN: [id number] [facility] PROGRESS NOTE  Jim BergeronWilliam Shields [id number] DOB: 1931-08-15 DOA: 09/05/2014 [facility]  Brief narrative: 80 yo male with COPD, HTN, presented with main concern of several days duration of progressively worsening generalized abd pain associated with nausea and non bloody vomiting, poor oral intake. CT abd c/q pSBO.   Assessment and Plan:    Principal Problem:   SBO (small bowel obstruction) Repeat CT shows high grade obstruction.  S/p OR today for ex lap, loa- 10/30  Reported limited blood from NGT 10/28: no gross blood now. Vitals ok. Likely NG trauma.empiric ppi. scds only for dvt prophylaxis.    HTN (hypertension) Change po meds to iv while NPO    COPD (chronic obstructive pulmonary disease) Stable    7 mm lingular nodule - noted on CT chest - pt with history of tobacco use - recommendation is to have repeat CT chest at 3-6 months to ensure stability     DM type II - A1C 7.5 this admission Controlled    Hypokalemia mag ok. Correct IV  Hypernatremia: on D5W  Code Status: Full Family Communication: no family at bedside Disposition Plan: await AM labs  Medical Consultants:   Surgery   09/10/2014, 9:35 AM   LOS: 5 days   HPI/Subjective: No CP, no SOB  Objective: Filed Vitals:   09/10/14 0049 09/10/14 0439 09/10/14 0757 09/10/14 0819  BP: 119/60 125/69 124/77   Pulse: 74 83 84 75  Temp: 97.8 F (36.6 C) 99.2 F (37.3 C) 97.6 F (36.4 C)   TempSrc: Oral Oral Oral   Resp: 16 18 18 18   Height:      Weight:  67.495 kg (148 lb 12.8 oz)    SpO2: 97% 95% 98% 97%    Intake/Output Summary (Last 24 hours) at 09/10/14 0935 Last data filed at 09/10/14 0443  Gross per 24 hour  Intake   1200 ml  Output   2625 ml  Net  -1425 ml    Exam:   General:  Asleep but will awaken  HEENT: hearing aids. MMM. NG draining  bilious.  Cardiovascular: Regular rate and rhythm, no rubs, no gallops  Respiratory: Clear to auscultation bilaterally, no wheezing, diminished breath sounds at bases   Abdomen: Soft, non tender, slightly distended, no guarding. Decreased bowel sounds   Data Reviewed: Basic Metabolic Panel:  Recent Labs Lab 09/05/14 1650 09/06/14 0145 09/07/14 0530 09/07/14 0535 09/08/14 0915 09/09/14 0923  NA 140 140 145  --  151* 148*  K 3.1* 3.1* 3.4*  --  4.2 3.3*  CL 98 95* 106  --  109 105  CO2 30 27 27   --  27 27  GLUCOSE 214* 317* 138*  --  99 112*  BUN 8 11 20   --  24* 35*  CREATININE 1.04 1.03 1.22  --  1.09 1.35  CALCIUM 9.5 9.4 9.0  --  9.2 8.6  MG  --   --   --  2.1  --   --    Liver Function Tests:  Recent Labs Lab 09/05/14 1528  AST 18  ALT 11  ALKPHOS 105  BILITOT 0.7  PROT 7.7  ALBUMIN 3.9    Recent Labs Lab 09/05/14 1528  LIPASE 31   CBC:  Recent Labs Lab 09/05/14 1650 09/06/14 0145 09/07/14 0530 09/08/14 0915 09/09/14 0923  WBC 5.9 9.0 5.6  --  4.0  HGB 15.7 15.7 16.5 16.2 15.6  HCT 44.5 45.0 46.8 47.3 45.4  MCV 84.3 86.2 86.8  --  86.5  PLT 152 157 144*  --  148*   CBG:  Recent Labs Lab 09/09/14 1145 09/09/14 2026 09/10/14 0001 09/10/14 0434 09/10/14 0755  GLUCAP 117* 161* 115* 128* 117*      Continuous Infusions: . sodium chloride 125 mL/hr at 09/10/14 0156  . lactated ringers 10 mL/hr at 09/09/14 1558   Marlin CanaryJessica Sheena Simonis [facility] [phone]

## 2014-09-10 NOTE — Progress Notes (Signed)
[facility]S/Darion Milewski Progress Note 1 Day Post-Op  Subjective: Patient is awake and alert.  Seems to be doing pretty good.  No acute distress.  Objective: Vital signs in last 24 hours: Temp:  [96.4 F (35.8 C)-99.2 F (37.3 C)] 97.6 F (36.4 C) (10/31 0757) Pulse Rate:  [52-105] 84 (10/31 0757) Resp:  [10-19] 18 (10/31 0757) BP: (106-172)/(48-96) 124/77 mmHg (10/31 0757) SpO2:  [59 %-98 %] 98 % (10/31 0757) Weight:  [67.495 kg (148 lb 12.8 oz)] 67.495 kg (148 lb 12.8 oz) (10/31 0439) Last BM Date: 09/08/14  Intake/Output from previous day: 10/30 0701 - 10/31 0700 In: 1200 [I.V.:1200] Out: 2925 [Urine:850; Emesis/NG output:1025; Blood:50] Intake/Output this shift:    General: No acute distress  Lungs: Clear  Abd: Soft, mildly distended.  Not a lot of NGT output recorded.  Hypoactive bowel sounds.  Extremities: No clinical signs or symptoms of DVT  Neuro: Intact  Lab Results:   BMET  Recent Labs  09/08/14 0915 09/09/14 0923  NA 151* 148*  K 4.2 3.3*  CL 109 105  CO2 27 27  GLUCOSE 99 112*  BUN 24* 35*  CREATININE 1.09 1.35  CALCIUM 9.2 8.6   PT/INR No results found for this basename: LABPROT, INR,  in the last 72 hours ABG No results found for this basename: PHART, PCO2, PO2, HCO3,  in the last 72 hours  Studies/Results: Ct Abdomen Pelvis Wo Contrast  09/09/2014   CLINICAL DATA:  Abdominal pain, nausea, vomiting.  EXAM: CT ABDOMEN AND PELVIS WITHOUT CONTRAST  TECHNIQUE: Multidetector CT imaging of the abdomen and pelvis was performed following the standard protocol without IV contrast.  COMPARISON:  09/05/2014  FINDINGS: Upper normal to mildly enlarged heart size. Coronary artery calcifications. Bibasilar atelectasis and/or scarring. NG tube descends into the proximal stomach.  Organ evaluation is limited in the absence of intravenous contrast. Within this limitation, hepatic and splenic granuloma. A peripherally enhancing liver lesion on the recent prior is not  visualized without contrast. The enhancement characteristics favors that of hemangioma. Slight central biliary ductal dilatation status post cholecystectomy. Lipoma along the pancreatic head. No adrenal lesion. Bilateral renal cysts. No hydroureteronephrosis.  Decompressed colon and distal small bowel loops. Normal appendix. Distention of proximal and mid small bowel loops up to 4 cm. Focal transition point along a kinked loop in the right hemi-abdomen (series 2, image 51).  Small amount of perihepatic and interloop fluid. No bowel wall pneumatosis. No free intraperitoneal air.  Scattered atherosclerotic disease of the aorta and branch vessels without aneurysmal dilatation.  Thin walled bladder.  Upper normal size prostate gland.  Left SI joint ankylosis.  Multilevel degenerative changes.  IMPRESSION: Small bowel obstruction has progressed, now high grade. Right hemi abdomen transition point, favor an underlying adhesion. Small amount of interloop fluid.   Electronically Signed   By: [name]  DelGaizo M.D.   On: 09/09/2014 06:49   Dg Abd 2 Views  09/09/2014   CLINICAL DATA:  Small bowel obstruction.  Abdominal distention.  EXAM: ABDOMEN - 2 VIEW  COMPARISON:  CT abdomen and pelvis 09/09/2014 and abdominal radiographs 09/08/2014  FINDINGS: No definite intraperitoneal free air is identified. Multiple gas-filled loops of dilated small bowel are again seen in the mid to upper abdomen, measuring up to 5.3 cm in diameter, similar to prior radiographs. Multiple fluid-filled small bowel loops are present lower in the abdomen. Multiple small bowel air-fluid levels are seen. There is a paucity of colonic gas. Enteric tube remains in place with tip projecting  over the proximal stomach. Multiple surgical clips are again seen in the right upper quadrant. Thoracolumbar spondylosis is noted.  IMPRESSION: Persistent high-grade small bowel obstruction.   Electronically Signed   By: [name]  Grady   On: 09/09/2014 08:18   Dg Abd 2  Views  09/08/2014   CLINICAL DATA:  Small bowel obstruction  EXAM: ABDOMEN - 2 VIEW  COMPARISON:  09/07/2014  FINDINGS: NG tube tip in the fundus of the stomach. Dilated small bowel loops with air-fluid levels are stable. There is no free intraperitoneal gas. Postoperative changes are noted.  IMPRESSION: Stable partial small bowel obstruction pattern. No free intraperitoneal gas.   Electronically Signed   By: [name]  Hoss M.D.   On: 09/08/2014 12:05    Anti-infectives: Anti-infectives   Start     Dose/Rate Route Frequency Ordered Stop   09/09/14 0945  [MAR Hold]  cefOXitin (MEFOXIN) 2 g in dextrose 5 % 50 mL IVPB     (On MAR Hold since 09/09/14 1541)   2 g 100 mL/hr over 30 Minutes Intravenous On call to O.R. 09/09/14 0944 09/09/14 1645      Assessment/Plan: s/p Procedure(s): EXPLORATORY LAPAROTOMY LYSIS OF ADHESION Saline bolus. Check labs tomorrow AM. OOB with assistance. Continue NGT  LOS: 5 days   [name], [name], MD, FACS [phone]--pager [phone]--office [facility] 09/10/2014

## 2014-09-10 NOTE — Evaluation (Signed)
Physical Therapy Evaluation Patient Details Name: Jim BergeronWilliam Brune MRN: [id number] DOB: January 18, 1931 Today's Date: 09/10/2014   History of Present Illness  80 yo male with COPD, HTN, presented with main concern of several days duration of progressively worsening generalized abd pain associated with nausea and non bloody vomiting, poor oral intake. CT abd c/q pSBO.  Pt s/p exploratory laparotomy on 10/30.   Clinical Impression  Patient demonstrates deficits in functional mobility as indicated below. Will benefit from continued skilled PT to address deficits and maximize function. Will see as indicated and progress as tolerated. While patient may benefit from ST SNF, nsg states family desires to take patient home with assist, therefore recommend HHPT and 24/7 assist.    Follow Up Recommendations Home health PT;Supervision/Assistance - 24 hour    Equipment Recommendations  Rolling walker with 5" wheels    Recommendations for Other Services       Precautions / Restrictions Precautions Precautions: Fall Precaution Comments: NG tube in place on wall suction, clamped for ambulatin Restrictions Weight Bearing Restrictions: No      Mobility  Bed Mobility Overal bed mobility: Needs Assistance Bed Mobility: Rolling;Sidelying to Sit Rolling: Mod assist Sidelying to sit: Mod assist       General bed mobility comments: visual cues for assist, patient with difficulty elevating to upright secondary to weakness and abdominal pain, assist to rotate hips to EOB  Transfers Overall transfer level: Needs assistance Equipment used: 2 person hand held assist Transfers: Sit to/from Stand Sit to Stand: Min assist;+2 physical assistance         General transfer comment: Bilateral HHA requried, patient with fwd flexed posture, Tactile cues for upright  Ambulation/Gait Ambulation/Gait assistance: Min assist;Mod assist (mod assist during turns and tight space navigation) Ambulation Distance  (Feet): 34 Feet Assistive device: 1 person hand held assist Gait Pattern/deviations: Step-through pattern;Decreased stride length;Shuffle;Trunk flexed;Narrow base of support Gait velocity: decreased Gait velocity interpretation: <1.8 ft/sec, indicative of risk for recurrent falls General Gait Details: some instablity noted, patient with increased flexed posture, assist for stability and tactile cues for upright posture  Stairs            Wheelchair Mobility    Modified Rankin (Stroke Patients Only)       Balance Overall balance assessment: Needs assistance Sitting-balance support: Feet supported Sitting balance-Leahy Scale: Fair     Standing balance support: During functional activity Standing balance-Leahy Scale: Poor                               Pertinent Vitals/Pain Pain Assessment: 0-10 Pain Score: 3  Pain Location: abdominal Pain Descriptors / Indicators: Constant Pain Intervention(s): Limited activity within patient's tolerance;Monitored during session;Repositioned    Home Living Family/patient expects to be discharged to:: Private residence Living Arrangements: Spouse/significant other   Type of Home: House Home Access: Stairs to enter Entrance Stairs-Rails: Doctor, general practiceight;Left Entrance Stairs-Number of Steps: 3 Home Layout: One level Home Equipment: None      Prior Function Level of Independence: Needs assistance         Comments: wife assists     Hand Dominance   Dominant Hand: Right    Extremity/Trunk Assessment               Lower Extremity Assessment: Generalized weakness      Cervical / Trunk Assessment:  (abdominal pain)  Communication   Communication: HOH (has hearing aids but extremely HOH)  Cognition Arousal/Alertness: Awake/alert  Behavior During Therapy: WFL for tasks assessed/performed Overall Cognitive Status: No family/caregiver present to determine baseline cognitive functioning                       General Comments      Exercises        Assessment/Plan    PT Assessment Patient needs continued PT services  PT Diagnosis Difficulty walking;Abnormality of gait;Generalized weakness;Acute pain   PT Problem List Decreased strength;Decreased activity tolerance;Decreased balance;Decreased mobility;Pain  PT Treatment Interventions     PT Goals (Current goals can be found in the Care Plan section) Acute Rehab PT Goals Patient Stated Goal: to go home PT Goal Formulation: With patient Time For Goal Achievement: 09/24/14 Potential to Achieve Goals: Good    Frequency Min 3X/week   Barriers to discharge        Co-evaluation               End of Session Equipment Utilized During Treatment: Gait belt Activity Tolerance: Patient tolerated treatment well;Patient limited by fatigue Patient left: in chair;with call bell/phone within reach Nurse Communication: Mobility status         Time: [id number] PT Time Calculation (min): 21 min   Charges:   PT Evaluation $Initial PT Evaluation Tier I: 1 Procedure PT Treatments $Therapeutic Activity: 8-22 mins   PT G CodesFabio Asa:          Jaelynn Currier J 09/10/2014, 3:13 PM Charlotte Crumbevon Keylin Podolsky, PT DPT  [phone]

## 2014-09-11 DIAGNOSIS — E870 Hyperosmolality and hypernatremia: Secondary | ICD-10-CM

## 2014-09-11 LAB — BASIC METABOLIC PANEL
Anion gap: 20 — ABNORMAL HIGH (ref 5–15)
BUN: 34 mg/dL — ABNORMAL HIGH (ref 6–23)
CALCIUM: 8.1 mg/dL — AB (ref 8.4–10.5)
CO2: 20 mEq/L (ref 19–32)
Chloride: 107 mEq/L (ref 96–112)
Creatinine, Ser: 1.55 mg/dL — ABNORMAL HIGH (ref 0.50–1.35)
GFR calc Af Amer: 47 mL/min — ABNORMAL LOW (ref >90)
GFR, EST NON AFRICAN AMERICAN: 41 mL/min — AB (ref >90)
Glucose, Bld: 139 mg/dL — ABNORMAL HIGH (ref 70–99)
Potassium: 3.6 mEq/L — ABNORMAL LOW (ref 3.7–5.3)
SODIUM: 147 meq/L (ref 137–147)

## 2014-09-11 LAB — GLUCOSE, CAPILLARY
Glucose-Capillary: 105 mg/dL — ABNORMAL HIGH (ref 70–99)
Glucose-Capillary: 124 mg/dL — ABNORMAL HIGH (ref 70–99)
Glucose-Capillary: 131 mg/dL — ABNORMAL HIGH (ref 70–99)
Glucose-Capillary: 136 mg/dL — ABNORMAL HIGH (ref 70–99)
Glucose-Capillary: 146 mg/dL — ABNORMAL HIGH (ref 70–99)
Glucose-Capillary: 148 mg/dL — ABNORMAL HIGH (ref 70–99)

## 2014-09-11 LAB — CBC WITH DIFFERENTIAL/PLATELET
BASOS ABS: 0 10*3/uL (ref 0.0–0.1)
BASOS PCT: 0 % (ref 0–1)
EOS ABS: 0.2 10*3/uL (ref 0.0–0.7)
EOS PCT: 4 % (ref 0–5)
HCT: 43 % (ref 39.0–52.0)
Hemoglobin: 14.5 g/dL (ref 13.0–17.0)
Lymphocytes Relative: 10 % — ABNORMAL LOW (ref 12–46)
Lymphs Abs: 0.6 10*3/uL — ABNORMAL LOW (ref 0.7–4.0)
MCH: 29.4 pg (ref 26.0–34.0)
MCHC: 33.7 g/dL (ref 30.0–36.0)
MCV: 87.2 fL (ref 78.0–100.0)
Monocytes Absolute: 1.2 10*3/uL — ABNORMAL HIGH (ref 0.1–1.0)
Monocytes Relative: 19 % — ABNORMAL HIGH (ref 3–12)
Neutro Abs: 4.1 10*3/uL (ref 1.7–7.7)
Neutrophils Relative %: 67 % (ref 43–77)
PLATELETS: 141 10*3/uL — AB (ref 150–400)
RBC: 4.93 MIL/uL (ref 4.22–5.81)
RDW: 14.5 % (ref 11.5–15.5)
WBC: 6.1 10*3/uL (ref 4.0–10.5)

## 2014-09-11 MED ORDER — ENOXAPARIN SODIUM 40 MG/0.4ML ~~LOC~~ SOLN
40.0000 mg | SUBCUTANEOUS | Status: DC
  Administered 2014-09-11 – 2014-09-15 (×5): 40 mg via SUBCUTANEOUS
  Filled 2014-09-11 (×6): qty 0.4

## 2014-09-11 MED ORDER — SODIUM CHLORIDE 0.9 % IV SOLN
INTRAVENOUS | Status: DC
  Administered 2014-09-11: 13:00:00 via INTRAVENOUS
  Filled 2014-09-11 (×3): qty 1000

## 2014-09-11 NOTE — Progress Notes (Signed)
2 Days Post-Op  Subjective: In bed the NG is out he, is very hard of hearing,  i think he has walked.  ? Flatus. He has ice chips at the bedside.  Objective: Vital signs in last 24 hours: Temp:  [97.6 F (36.4 C)-98.7 F (37.1 C)] 98.7 F (37.1 C) (11/01 0354) Pulse Rate:  [75-88] 85 (11/01 0354) Resp:  [18-20] 20 (11/01 0354) BP: (114-151)/(59-77) 151/72 mmHg (11/01 0354) SpO2:  [93 %-98 %] 93 % (11/01 0354) Last BM Date: 09/08/14 Nothing recorded Diet:  None Afebrile, VSS Creatinine is improving Film yesterday show ongoing SBO, I cannot pull up film this Am  Intake/Output from previous day:   Intake/Output this shift:    General appearance: alert, cooperative and no distress Resp: clear to auscultation bilaterally and few rales in bases. GI: mildly distended, few BS, I'm not sure if he is passing flatus or not.  Lab Results:   Recent Labs  09/10/14 0943 09/11/14 0500  WBC 6.1 6.1  HGB 14.7 14.5  HCT 42.7 43.0  PLT 128* 141*    BMET  Recent Labs  09/10/14 0943 09/11/14 0500  NA 143 147  K 3.4* 3.6*  CL 103 107  CO2 23 20  GLUCOSE 117* 139*  BUN 41* 34*  CREATININE 1.75* 1.55*  CALCIUM 7.5* 8.1*   PT/INR No results for input(s): LABPROT, INR in the last 72 hours.   Recent Labs Lab 09/05/14 1528  AST 18  ALT 11  ALKPHOS 105  BILITOT 0.7  PROT 7.7  ALBUMIN 3.9     Lipase     Component Value Date/Time   LIPASE 31 09/05/2014 1528     Studies/Results: No results found.  Medications: . diphenhydrAMINE  12.5 mg Intravenous Once  . insulin aspart  0-9 Units Subcutaneous 6 times per day  . insulin glargine  5 Units Subcutaneous QHS  . latanoprost  1 drop Both Eyes QHS  . metoprolol  5 mg Intravenous 4 times per day  . pantoprazole (PROTONIX) IV  40 mg Intravenous Q12H  . pneumococcal 23 valent vaccine  0.5 mL Intramuscular Tomorrow-1000  . tiotropium  18 mcg Inhalation Daily    Assessment/Plan Bowel obstruction EXPLORATORY  LAPAROTOMY, LYSIS OF ADHESION, Jim SchmidtJay Wyatt, MD, 09/09/2014  Hypertension COPD/hx of tobacco use Lingular nodule AODM Type II Hypokalemia Post op ileus  Plan;  Mobilize, IS, DVT prop/Lovenox, dressing changes.  Add K+ to IV solution to replace K+.       LOS: 6 days    Jim Shields 09/11/2014

## 2014-09-11 NOTE — Progress Notes (Signed)
Patient found with NG tube pulled out. Patient in no apparent distress with no complaint of pain or discomfort. Patient has hypoactive bowel sounds x4, with some distension. On-call physician notified, no orders at this time will continue to monitor.

## 2014-09-11 NOTE — Progress Notes (Signed)
Physical Therapy Treatment Patient Details Name: Freada BergeronWilliam Tingley MRN: [id number] DOB: [birth year] Today's Date: 09/11/2014    History of Present Illness 80 yo male with COPD, HTN, presented with main concern of several days duration of progressively worsening generalized abd pain associated with nausea and non bloody vomiting, poor oral intake. CT abd c/q pSBO.  Pt s/p exploratory laparotomy on 10/30.     PT Comments    Patient with good progress towards physical therapy goals today, ambulating up to 80 feet with min guard assist while using a rolling walker. Positive flatus while ambulating. Tolerates therapeutic exercises well. Will need to participate in stair training prior to d/c. Patient will continue to benefit from skilled physical therapy services to further improve independence with functional mobility.   Follow Up Recommendations  Home health PT;Supervision/Assistance - 24 hour     Equipment Recommendations  Rolling walker with 5" wheels    Recommendations for Other Services       Precautions / Restrictions Precautions Precautions: Fall Restrictions Weight Bearing Restrictions: No    Mobility  Bed Mobility Overal bed mobility: Needs Assistance Bed Mobility: Supine to Sit;Sit to Supine     Supine to sit: Min guard;HOB elevated Sit to supine: Min guard;HOB elevated   General bed mobility comments: Min guard for safety. VC for technique. Requires extra time but no physical assist. Heavy use of bed rail.  Transfers Overall transfer level: Needs assistance Equipment used: Rolling walker (2 wheeled) Transfers: Sit to/from Stand Sit to Stand: Min assist         General transfer comment: Min assist for boost to stand, VC for hand placement. Leans posteriorly initially but was able to correct. Slow to rise. VC for upright posture once standing.  Ambulation/Gait Ambulation/Gait assistance: Min guard Ambulation Distance (Feet): 80 Feet Assistive device: Rolling  walker (2 wheeled) Gait Pattern/deviations: Step-through pattern;Decreased stride length;Trunk flexed   Gait velocity interpretation: Below normal speed for age/gender General Gait Details: Very slow and guarded. Frequent VC for upright posture and forward gaze. Good control of rolling walker. No loss of balance noted during bout. Cues to keep walker within BOS prior to sitting back on bed.   Stairs            Wheelchair Mobility    Modified Rankin (Stroke Patients Only)       Balance                                    Cognition Arousal/Alertness: Awake/alert Behavior During Therapy: WFL for tasks assessed/performed Overall Cognitive Status: No family/caregiver present to determine baseline cognitive functioning                      Exercises General Exercises - Lower Extremity Long Arc Quad: Strengthening;Both;10 reps;Seated Hip Flexion/Marching: Strengthening;Both;10 reps;Seated    General Comments General comments (skin integrity, edema, etc.): Positive flatus during ambulation      Pertinent Vitals/Pain Pain Assessment: 0-10 Pain Score:  ("just hurts when I cough" no value given) Pain Location: lower abdomen Pain Intervention(s): Monitored during session    Home Living                      Prior Function            PT Goals (current goals can now be found in the care plan section) Acute Rehab PT Goals Patient Stated Goal: to  go home PT Goal Formulation: With patient Time For Goal Achievement: 09/24/14 Potential to Achieve Goals: Good Progress towards PT goals: Progressing toward goals    Frequency  Min 3X/week    PT Plan Current plan remains appropriate    Co-evaluation             End of Session Equipment Utilized During Treatment: Gait belt Activity Tolerance: Patient tolerated treatment well Patient left: with call bell/phone within reach;in bed     Time: 1520-1545 PT Time Calculation (min): 25  min  Charges:  $Gait Training: 8-22 mins $Therapeutic Activity: 8-22 mins                    G Codes:      BJ[employer] WholesaleLogan Secor Duc Crocket, [place] CarolinaPT [phone]  [place] Mount[place], Dama Hedgepeth S 09/11/2014, 4:54 PM

## 2014-09-11 NOTE — Progress Notes (Addendum)
Patient ID: Jim BergeronWilliam Tippin, male   DOB: 1931/08/07, 80 y.o.   MRN: [id number] [facility] PROGRESS NOTE  Jim BergeronWilliam Torti [id number] DOB: 1931/08/07 DOA: 09/05/2014 [facility]  Brief narrative: 80 yo male with COPD, HTN, presented with main concern of several days duration of progressively worsening generalized abd pain associated with nausea and non bloody vomiting, poor oral intake. CT abd c/q pSBO.   Assessment and Plan:       SBO (small bowel obstruction) Repeat CT shows high grade obstruction.  S/p OR for ex lap, loa- 10/30 - NG Tube came out 11/1  Reported limited blood from NGT 10/28: no gross blood now. Vitals ok. Likely NG trauma.empiric ppi. scds only for dvt prophylaxis.    HTN (hypertension) Change po meds to iv while NPO    COPD (chronic obstructive pulmonary disease) Stable    7 mm lingular nodule - noted on CT chest - pt with history of tobacco use - recommendation is to have repeat CT chest at 3-6 months to ensure stability     DM type II - A1C 7.5 this admission Controlled    Hypokalemia mag ok. Correct IV  AKI- IVF  Hypernatremia: on D5W  Code Status: Full Family Communication: no family at bedside- called wife- sent to voicemail Disposition Plan:   Medical Consultants:   Surgery   09/11/2014, 10:25 AM   LOS: 6 days   HPI/Subjective: NG tube came out last PM -when asked if he is passing gas, says he passing something from down there  Objective: Filed Vitals:   09/10/14 0819 09/10/14 1349 09/10/14 2007 09/11/14 0354  BP:  114/59 143/60 151/72  Pulse: 75 86 88 85  Temp:  98.6 F (37 C) 97.8 F (36.6 C) 98.7 F (37.1 C)  TempSrc:  Oral Oral Oral  Resp: 18 18 18 20   Height:      Weight:      SpO2: 97% 97% 93% 93%    Intake/Output Summary (Last 24 hours) at 09/11/14 1025 Last data filed at 09/11/14 0700  Gross per 24 hour  Intake      0 ml  Output      0 ml  Net      0 ml     Exam:   General:  Awake, NAD  HEENT: hearing aids. MMM. NG draining bilious.  Cardiovascular: Regular rate and rhythm, no rubs, no gallops  Respiratory: Clear to auscultation bilaterally, no wheezing, diminished breath sounds at bases   Abdomen:  distended, no guarding. No BS   Data Reviewed: Basic Metabolic Panel:  Recent Labs Lab 09/07/14 0530 09/07/14 0535 09/08/14 0915 09/09/14 0923 09/10/14 0943 09/11/14 0500  NA 145  --  151* 148* 143 147  K 3.4*  --  4.2 3.3* 3.4* 3.6*  CL 106  --  109 105 103 107  CO2 27  --  27 27 23 20   GLUCOSE 138*  --  99 112* 117* 139*  BUN 20  --  24* 35* 41* 34*  CREATININE 1.22  --  1.09 1.35 1.75* 1.55*  CALCIUM 9.0  --  9.2 8.6 7.5* 8.1*  MG  --  2.1  --   --   --   --    Liver Function Tests:  Recent Labs Lab 09/05/14 1528  AST 18  ALT 11  ALKPHOS 105  BILITOT 0.7  PROT 7.7  ALBUMIN 3.9    Recent Labs Lab 09/05/14 1528  LIPASE 31  CBC:  Recent Labs Lab 09/06/14 0145 09/07/14 0530 09/08/14 0915 09/09/14 0923 09/10/14 0943 09/11/14 0500  WBC 9.0 5.6  --  4.0 6.1 6.1  NEUTROABS  --   --   --   --   --  4.1  HGB 15.7 16.5 16.2 15.6 14.7 14.5  HCT 45.0 46.8 47.3 45.4 42.7 43.0  MCV 86.2 86.8  --  86.5 86.8 87.2  PLT 157 144*  --  148* 128* 141*   CBG:  Recent Labs Lab 09/10/14 1128 09/10/14 1627 09/10/14 2004 09/11/14 0019 09/11/14 0501  GLUCAP 108* 117* 153* 105* 148*      Continuous Infusions: . lactated ringers 10 mL/hr at 09/09/14 1558  . 0.9 % sodium chloride with kcl     Marlin CanaryJessica Dreanna Kyllo [facility] [phone]

## 2014-09-12 ENCOUNTER — Encounter (HOSPITAL_COMMUNITY): Payer: Self-pay | Admitting: General Surgery

## 2014-09-12 LAB — CBC
HEMATOCRIT: 40.7 % (ref 39.0–52.0)
HEMOGLOBIN: 13.9 g/dL (ref 13.0–17.0)
MCH: 29.3 pg (ref 26.0–34.0)
MCHC: 34.2 g/dL (ref 30.0–36.0)
MCV: 85.7 fL (ref 78.0–100.0)
Platelets: 139 10*3/uL — ABNORMAL LOW (ref 150–400)
RBC: 4.75 MIL/uL (ref 4.22–5.81)
RDW: 14.3 % (ref 11.5–15.5)
WBC: 4.7 10*3/uL (ref 4.0–10.5)

## 2014-09-12 LAB — GLUCOSE, CAPILLARY
GLUCOSE-CAPILLARY: 134 mg/dL — AB (ref 70–99)
GLUCOSE-CAPILLARY: 146 mg/dL — AB (ref 70–99)
GLUCOSE-CAPILLARY: 163 mg/dL — AB (ref 70–99)
GLUCOSE-CAPILLARY: 190 mg/dL — AB (ref 70–99)
Glucose-Capillary: 113 mg/dL — ABNORMAL HIGH (ref 70–99)
Glucose-Capillary: 108 mg/dL — ABNORMAL HIGH (ref 70–99)
Glucose-Capillary: 218 mg/dL — ABNORMAL HIGH (ref 70–99)

## 2014-09-12 LAB — BASIC METABOLIC PANEL
Anion gap: 18 — ABNORMAL HIGH (ref 5–15)
BUN: 27 mg/dL — ABNORMAL HIGH (ref 6–23)
CALCIUM: 8 mg/dL — AB (ref 8.4–10.5)
CO2: 19 meq/L (ref 19–32)
CREATININE: 1.33 mg/dL (ref 0.50–1.35)
Chloride: 111 mEq/L (ref 96–112)
GFR calc Af Amer: 57 mL/min — ABNORMAL LOW (ref >90)
GFR calc non Af Amer: 49 mL/min — ABNORMAL LOW (ref >90)
GLUCOSE: 110 mg/dL — AB (ref 70–99)
Potassium: 3.6 mEq/L — ABNORMAL LOW (ref 3.7–5.3)
Sodium: 148 mEq/L — ABNORMAL HIGH (ref 137–147)

## 2014-09-12 MED ORDER — SODIUM CHLORIDE 0.9 % IV SOLN
INTRAVENOUS | Status: DC
  Administered 2014-09-12: 09:00:00 via INTRAVENOUS
  Filled 2014-09-12 (×2): qty 1000

## 2014-09-12 MED ORDER — METOPROLOL SUCCINATE ER 25 MG PO TB24
25.0000 mg | ORAL_TABLET | Freq: Every day | ORAL | Status: DC
  Administered 2014-09-12 – 2014-09-15 (×4): 25 mg via ORAL
  Filled 2014-09-12 (×5): qty 1

## 2014-09-12 NOTE — Progress Notes (Signed)
Patient ID: Jim BergeronWilliam Shields, male   DOB: 1931/08/11, 80 y.o.   MRN: [id number] [facility] PROGRESS NOTE  Jim BergeronWilliam Shields [id number] DOB: 1931/08/11 DOA: 09/05/2014 [facility]  Brief narrative: 80 yo male with COPD, HTN, presented with main concern of several days duration of progressively worsening generalized abd pain associated with nausea and non bloody vomiting, poor oral intake. CT abd c/q pSBO.   Assessment and Plan:       SBO (small bowel obstruction) Repeat CT shows high grade obstruction.  S/p OR for ex lap, loa- 10/30 - NG Tube came out 11/1 - passing gas with PT on 11/1     HTN (hypertension) PO metoprolol    COPD (chronic obstructive pulmonary disease) Stable    7 mm lingular nodule - noted on CT chest - pt with history of tobacco use - recommendation is to have repeat CT chest at 3-6 months to ensure stability     DM type II - A1C 7.5 this admission Controlled on lantus/SSI    Hypokalemia repleted  AKI- resolving  Hypernatremia: encourage free water  Code Status: Full Family Communication: no family at bedside- called wife- sent to voicemail Disposition Plan:   Medical Consultants:   Surgery   09/12/2014, 10:22 AM   LOS: 7 days   HPI/Subjective: Passing gas; + BM  Objective: Filed Vitals:   09/11/14 1300 09/11/14 1800 09/11/14 2037 09/12/14 0419  BP: 146/73 142/69 150/70 146/72  Pulse: 74 69 81 83  Temp: 97.6 F (36.4 C)  97.8 F (36.6 C) 98.9 F (37.2 C)  TempSrc: Oral  Oral Oral  Resp: 18  18 18   Height:      Weight:    72.485 kg (159 lb 12.8 oz)  SpO2: 98%  95% 94%    Intake/Output Summary (Last 24 hours) at 09/12/14 1022 Last data filed at 09/12/14 0930  Gross per 24 hour  Intake    600 ml  Output   1550 ml  Net   -950 ml    Exam:   General:  Awake, NAD  HEENT: hearing aids. MMM. NG draining bilious.  Cardiovascular: Regular rate and rhythm, no rubs, no  gallops  Respiratory: Clear to auscultation bilaterally, no wheezing, diminished breath sounds at bases   Abdomen:  distended, appropriately tender   Data Reviewed: Basic Metabolic Panel:  Recent Labs Lab 09/07/14 0535 09/08/14 0915 09/09/14 0923 09/10/14 0943 09/11/14 0500 09/12/14 0424  NA  --  151* 148* 143 147 148*  K  --  4.2 3.3* 3.4* 3.6* 3.6*  CL  --  109 105 103 107 111  CO2  --  27 27 23 20 19   GLUCOSE  --  99 112* 117* 139* 110*  BUN  --  24* 35* 41* 34* 27*  CREATININE  --  1.09 1.35 1.75* 1.55* 1.33  CALCIUM  --  9.2 8.6 7.5* 8.1* 8.0*  MG 2.1  --   --   --   --   --    Liver Function Tests:  Recent Labs Lab 09/05/14 1528  AST 18  ALT 11  ALKPHOS 105  BILITOT 0.7  PROT 7.7  ALBUMIN 3.9    Recent Labs Lab 09/05/14 1528  LIPASE 31   CBC:  Recent Labs Lab 09/07/14 0530 09/08/14 0915 09/09/14 0923 09/10/14 0943 09/11/14 0500 09/12/14 0424  WBC 5.6  --  4.0 6.1 6.1 4.7  NEUTROABS  --   --   --   --  4.1  --   HGB 16.5 16.2 15.6 14.7 14.5 13.9  HCT 46.8 47.3 45.4 42.7 43.0 40.7  MCV 86.8  --  86.5 86.8 87.2 85.7  PLT 144*  --  148* 128* 141* 139*   CBG:  Recent Labs Lab 09/11/14 1624 09/11/14 2030 09/12/14 09/12/14 0417 09/12/14 0816  GLUCAP 146* 136* 124* 113* 108*      Continuous Infusions: . lactated ringers 10 mL/hr at 09/09/14 1558   Marlin CanaryJessica Orla Estrin [facility] [phone]

## 2014-09-12 NOTE — Progress Notes (Signed)
09/12/2014 9:10 PM Patient refused to ambulate this evening. Will continue to monitor and encourage to ambulate. Jim Shields, [name] E

## 2014-09-12 NOTE — Progress Notes (Signed)
3 Days Post-Op  Subjective: Jim Shields says Jim Shields's had some gas and stool.  I have no idea if Jim Shields understands a word I'm saying Nothing recorded. Objective: Vital signs in last 24 hours: Temp:  [97.6 F (36.4 C)-98.9 F (37.2 C)] 98.9 F (37.2 C) (11/02 0419) Pulse Rate:  [69-87] 83 (11/02 0419) Resp:  [18] 18 (11/02 0419) BP: (142-150)/(67-73) 146/72 mmHg (11/02 0419) SpO2:  [94 %-98 %] 94 % (11/02 0419) Weight:  [72.485 kg (159 lb 12.8 oz)] 72.485 kg (159 lb 12.8 oz) (11/02 0419) Last BM Date: 09/08/14 Nothing PO recorded NPO except ice Afebrile, VSS K+ 3.6 Creatinine is better. NO BM recorded.  Intake/Output from previous day: 11/01 0701 - 11/02 0700 In: 0  Out: 1550 [Urine:1550] Intake/Output this shift:    General appearance: alert, cooperative and no distress Ears: Jim Shields is very deaf Resp: clear to auscultation bilaterally GI: still a little distended + BS, Jim Shields says Jim Shields has flatus.  Lab Results:   Recent Labs  09/11/14 0500 09/12/14 0424  WBC 6.1 4.7  HGB 14.5 13.9  HCT 43.0 40.7  PLT 141* 139*    BMET  Recent Labs  09/11/14 0500 09/12/14 0424  NA 147 148*  K 3.6* 3.6*  CL 107 111  CO2 20 19  GLUCOSE 139* 110*  BUN 34* 27*  CREATININE 1.55* 1.33  CALCIUM 8.1* 8.0*   PT/INR No results for input(s): LABPROT, INR in the last 72 hours.   Recent Labs Lab 09/05/14 1528  AST 18  ALT 11  ALKPHOS 105  BILITOT 0.7  PROT 7.7  ALBUMIN 3.9     Lipase     Component Value Date/Time   LIPASE 31 09/05/2014 1528     Studies/Results: No results found.  Medications: . diphenhydrAMINE  12.5 mg Intravenous Once  . enoxaparin (LOVENOX) injection  40 mg Subcutaneous Q24H  . insulin aspart  0-9 Units Subcutaneous 6 times per day  . insulin glargine  5 Units Subcutaneous QHS  . latanoprost  1 drop Both Eyes QHS  . metoprolol  5 mg Intravenous 4 times per day  . pantoprazole (PROTONIX) IV  40 mg Intravenous Q12H  . pneumococcal 23 valent vaccine  0.5 mL  Intramuscular Tomorrow-1000  . tiotropium  18 mcg Inhalation Daily   . lactated ringers 10 mL/hr at 09/09/14 1558  . 0.9 % sodium chloride with kcl 75 mL/hr at 09/11/14 1259   Prior to Admission medications   Medication Sig Start Date End Date Taking? Authorizing Provider  acetaminophen (TYLENOL) 325 MG tablet Take 650 mg by mouth every 6 (six) hours as needed.   Yes Historical Provider, MD  albuterol (PROVENTIL) (2.5 MG/3ML) 0.083% nebulizer solution Take 2.5 mg by nebulization every 6 (six) hours as needed for wheezing or shortness of breath.   Yes Historical Provider, MD  amLODipine (NORVASC) 5 MG tablet Take 5 mg by mouth daily.   Yes Historical Provider, MD  aspirin 81 MG chewable tablet Chew 81 mg by mouth daily.   Yes Historical Provider, MD  atorvastatin (LIPITOR) 40 MG tablet Take 40 mg by mouth every evening.   Yes Historical Provider, MD  buPROPion (WELLBUTRIN XL) 150 MG 24 hr tablet Take 150 mg by mouth daily.   Yes Historical Provider, MD  Fluticasone-Salmeterol (ADVAIR) 250-50 MCG/DOSE AEPB Inhale 1 puff into the lungs 2 (two) times daily.   Yes Historical Provider, MD  hydrochlorothiazide (HYDRODIURIL) 25 MG tablet Take 25 mg by mouth daily.   Yes Historical Provider,  MD  insulin glargine (LANTUS) 100 UNIT/ML injection Inject 20 Units into the skin every morning.    Yes Historical Provider, MD  latanoprost (XALATAN) 0.005 % ophthalmic solution Place 1 drop into both eyes at bedtime.   Yes Historical Provider, MD  lisinopril (PRINIVIL,ZESTRIL) 40 MG tablet Take 40 mg by mouth daily.   Yes Historical Provider, MD  meloxicam (MOBIC) 7.5 MG tablet Take 7.5 mg by mouth daily.   Yes Historical Provider, MD  metFORMIN (GLUCOPHAGE) 1000 MG tablet Take 1,000 mg by mouth 2 (two) times daily with a meal.   Yes Historical Provider, MD  metoprolol succinate (TOPROL-XL) 25 MG 24 hr tablet Take 25 mg by mouth daily.   Yes Historical Provider, MD  Multiple Vitamin (MULITIVITAMIN WITH MINERALS)  TABS Take 1 tablet by mouth daily.   Yes Historical Provider, MD  tiotropium (SPIRIVA) 18 MCG inhalation capsule Place 18 mcg into inhaler and inhale daily.   Yes Historical Provider, MD     Assessment/Plan Bowel obstruction EXPLORATORY LAPAROTOMY, LYSIS OF ADHESION, Jim SchmidtJay Wyatt, MD, 09/09/2014  Hypertension COPD/hx of tobacco use Lingular nodule AODM Type II Hypokalemia/dehydration Post op ileus Hard of hearing    Plan:  Start clears and see how Jim Shields does.  Ambulate,  Get PT to see and help with mobilization. Jim Shields can start going back on PO meds if Jim Shields tolerates clear liquids.  I will switch him to PO toprol, and po pain medicine.  Add K+ to IV for potassium replacement.    LOS: 7 days    Jim Shields 09/12/2014

## 2014-09-12 NOTE — Plan of Care (Signed)
Problem: Phase II Progression Outcomes Goal: Progress activity as tolerated unless otherwise ordered Outcome: Progressing Goal: Vital signs remain stable Outcome: Progressing Goal: IV changed to normal saline lock Outcome: Progressing     

## 2014-09-13 ENCOUNTER — Inpatient Hospital Stay (HOSPITAL_COMMUNITY): Payer: Medicare Other

## 2014-09-13 DIAGNOSIS — R509 Fever, unspecified: Secondary | ICD-10-CM

## 2014-09-13 LAB — GLUCOSE, CAPILLARY
GLUCOSE-CAPILLARY: 147 mg/dL — AB (ref 70–99)
GLUCOSE-CAPILLARY: 188 mg/dL — AB (ref 70–99)
GLUCOSE-CAPILLARY: 112 mg/dL — AB (ref 70–99)
Glucose-Capillary: 139 mg/dL — ABNORMAL HIGH (ref 70–99)
Glucose-Capillary: 156 mg/dL — ABNORMAL HIGH (ref 70–99)
Glucose-Capillary: 173 mg/dL — ABNORMAL HIGH (ref 70–99)

## 2014-09-13 LAB — BASIC METABOLIC PANEL
Anion gap: 13 (ref 5–15)
BUN: 17 mg/dL (ref 6–23)
CALCIUM: 7.8 mg/dL — AB (ref 8.4–10.5)
CHLORIDE: 109 meq/L (ref 96–112)
CO2: 23 meq/L (ref 19–32)
CREATININE: 1.31 mg/dL (ref 0.50–1.35)
GFR calc Af Amer: 58 mL/min — ABNORMAL LOW (ref >90)
GFR calc non Af Amer: 50 mL/min — ABNORMAL LOW (ref >90)
GLUCOSE: 153 mg/dL — AB (ref 70–99)
Potassium: 3 mEq/L — ABNORMAL LOW (ref 3.7–5.3)
SODIUM: 145 meq/L (ref 137–147)

## 2014-09-13 MED ORDER — POTASSIUM CHLORIDE CRYS ER 20 MEQ PO TBCR
40.0000 meq | EXTENDED_RELEASE_TABLET | Freq: Three times a day (TID) | ORAL | Status: DC
  Administered 2014-09-13 – 2014-09-16 (×10): 40 meq via ORAL
  Filled 2014-09-13 (×11): qty 2

## 2014-09-13 MED ORDER — PANTOPRAZOLE SODIUM 40 MG PO TBEC
40.0000 mg | DELAYED_RELEASE_TABLET | Freq: Two times a day (BID) | ORAL | Status: DC
  Administered 2014-09-13 – 2014-09-16 (×6): 40 mg via ORAL
  Filled 2014-09-13 (×6): qty 1

## 2014-09-13 NOTE — Plan of Care (Signed)
Problem: Phase II Progression Outcomes Goal: Progress activity as tolerated unless otherwise ordered Outcome: Progressing Goal: IV changed to normal saline lock Outcome: Completed/Met Date Met:  09/13/14

## 2014-09-13 NOTE — Progress Notes (Signed)
09/13/2014 9:07 PM Central Telemetry informed me that the patient appeared to be in Atrial Fibrillation. I had an EKG done on the patient to be sure because parts on the monitor did not appear to be A Fib.  The EKG showed sinus rhythm with premature atrial complexes. Possible left atrial enlargement. Left ventricular hypertrophy with repolarization abnormality. Will continue to monitor the patient. Harriet Massonavidson, Nickoli Bagheri E

## 2014-09-13 NOTE — Progress Notes (Signed)
Patient ID: Jim BergeronWilliam Shields, male   DOB: [birth year], 80 y.o.   MRN: [id number] [facility] PROGRESS NOTE  Jim BergeronWilliam Shields NAT:[id number] DOB: [birth year] DOA: 09/05/2014 [facility]  Brief narrative: 80 yo male with COPD, HTN, presented with main concern of several days duration of progressively worsening generalized abd pain associated with nausea and non bloody vomiting, poor oral intake. CT abd c/q pSBO. Course after surgery prolonged due to fever, awaiting return of bowel function.  Will need SNF placement  Assessment and Plan:       SBO (small bowel obstruction) Repeat CT shows high grade obstruction.  S/p OR for ex lap, loa- 10/30 - NG Tube came out 11/1  -tolerating diet  [facility] -[facility] chest -need to mobilize    HTN (hypertension) PO metoprolol    COPD (chronic obstructive pulmonary disease) Stable    7 mm lingular nodule - noted on CT chest - pt with history of tobacco use - recommendation is to have repeat CT chest at 3-6 months to ensure stability     DM type II - A1C 7.5 this admission Controlled on lantus/SSI    Hypokalemia replete  AKI- resolving  Hypernatremia: encourage free water, resolved  Code Status: Full Family Communication: no family at bedside- called wife- sent to voicemail Disposition Plan: SNF  Medical Consultants:   Surgery   09/13/2014, 11:24 AM   LOS: 8 days   HPI/Subjective: Fever last PM  Objective: Filed Vitals:   09/12/14 2150 09/13/14 0830 09/13/14 0946 09/13/14 1109  BP:  112/63  123/70  Pulse:  91  98  Temp: 99.6 F (37.6 C) 98.8 F (37.1 C)    TempSrc: Oral Oral    Resp:  17    Height:      Weight:      SpO2:  95% 96%     Intake/Output Summary (Last 24 hours) at 09/13/14 1124 Last data filed at 09/13/14 0900  Gross per 24 hour  Intake   1230 ml  Output    550 ml  Net    680 ml    Exam:   General:  Awake, NAD  Cardiovascular:  Regular rate and rhythm, no rubs, no gallops  Respiratory: Clear to auscultation bilaterally, no wheezing, diminished breath sounds at bases   Abdomen:  distended, appropriately tender   Data Reviewed: Basic Metabolic Panel:  Recent Labs Lab 09/07/14 0535  09/09/14 0923 09/10/14 0943 09/11/14 0500 09/12/14 0424 09/13/14 0411  NA  --   < > 148* 143 147 148* 145  K  --   < > 3.3* 3.4* 3.6* 3.6* 3.0*  CL  --   < > 105 103 107 111 109  CO2  --   < > 27 23 20 19 23   GLUCOSE  --   < > 112* 117* 139* 110* 153*  BUN  --   < > 35* 41* 34* 27* 17  CREATININE  --   < > 1.35 1.75* 1.55* 1.33 1.31  CALCIUM  --   < > 8.6 7.5* 8.1* 8.0* 7.8*  MG 2.1  --   --   --   --   --   --   < > = values in this interval not displayed. Liver Function Tests: No results for input(s): AST, ALT, ALKPHOS, BILITOT, PROT, ALBUMIN in the last 168 hours. No results for input(s): LIPASE, AMYLASE in the last 168 hours. CBC:  Recent Labs  Lab 09/07/14 0530 09/08/14 0915 09/09/14 0923 09/10/14 0943 09/11/14 0500 09/12/14 0424  WBC 5.6  --  4.0 6.1 6.1 4.7  NEUTROABS  --   --   --   --  4.1  --   HGB 16.5 16.2 15.6 14.7 14.5 13.9  HCT 46.8 47.3 45.4 42.7 43.0 40.7  MCV 86.8  --  86.5 86.8 87.2 85.7  PLT 144*  --  148* 128* 141* 139*   CBG:  Recent Labs Lab 09/12/14 1621 09/12/14 2009 09/12/14 2335 09/13/14 0402 09/13/14 0811  GLUCAP 218* 190* 163* 147* 139*      Continuous Infusions:   Marlin CanaryJessica Candida Shields [facility] [phone]

## 2014-09-13 NOTE — Progress Notes (Signed)
09/13/2014 12:24 AM The patient ambulated about 50 feet in the hallway with a front wheel walker and the nurse tech and nurse. The patient tolerated the activity fairly, stating he got a little light headed.  Will continue to monitor patient. Harriet Massonavidson, Mescal Flinchbaugh E

## 2014-09-13 NOTE — Progress Notes (Signed)
Physical Therapy Treatment Patient Details Name: Jim BergeronWilliam Shields MRN: [id number] DOB: 1931-10-19 Today's Date: 09/13/2014    History of Present Illness 80 yo male with COPD, HTN, presented with main concern of several days duration of progressively worsening generalized abd pain associated with nausea and non bloody vomiting, poor oral intake. CT abd c/q pSBO.  Pt s/p exploratory laparotomy on 10/30.     PT Comments    Patient is progressing slowly towards physical therapy goals, requiring min assist intermittently for stability. Although endurance is improving, feel patient would benefit from additional rehabilitation at SNF prior to returning home. Disposition recommendation and frequency updated appropriately. Patient will continue to benefit from skilled physical therapy services to further improve independence with functional mobility.   Follow Up Recommendations  SNF;Supervision for mobility/OOB     Equipment Recommendations  Rolling walker with 5" wheels    Recommendations for Other Services       Precautions / Restrictions Precautions Precautions: Fall Restrictions Weight Bearing Restrictions: No    Mobility  Bed Mobility Overal bed mobility: Needs Assistance Bed Mobility: Supine to Sit     Supine to sit: Min guard;HOB elevated     General bed mobility comments: Min guard for safety with heavy use of rail and HOB elevated.  Transfers Overall transfer level: Needs assistance Equipment used: Rolling walker (2 wheeled) Transfers: Sit to/from Stand Sit to Stand: Min assist         General transfer comment: Min assist for balance upon standing. Using posterior knees on bed for stability. Conintues to lean posteriorly initially then requires cues for upright posture due to leaning over walker. Performed from lowest bed setting and toilet using grab bar.  Ambulation/Gait Ambulation/Gait assistance: Min assist Ambulation Distance (Feet): 100 Feet Assistive  device: Rolling walker (2 wheeled) Gait Pattern/deviations: Step-through pattern;Decreased stride length;Trunk flexed   Gait velocity interpretation: Below normal speed for age/gender General Gait Details: Requires frequent tactile and VC for upright posture during ambulation. Min assist for walker placement during turns for proximity to patients base of support. LOB on one instance while stepping backwards towards toilet requiring assist to correct.   Stairs            Wheelchair Mobility    Modified Rankin (Stroke Patients Only)       Balance                                    Cognition Arousal/Alertness: Awake/alert Behavior During Therapy: WFL for tasks assessed/performed Overall Cognitive Status: Difficult to assess                      Exercises General Exercises - Lower Extremity Ankle Circles/Pumps: AROM;Both;10 reps;Seated Long Arc Quad: Strengthening;Both;10 reps;Seated Hip Flexion/Marching: Strengthening;Both;10 reps;Seated    General Comments General comments (skin integrity, edema, etc.): pt with very loose bowel movement. RN aware.      Pertinent Vitals/Pain Pain Assessment: 0-10 Pain Score:  ("doing pretty good" no value given) Pain Location: abdomen Pain Intervention(s): Monitored during session;Repositioned    Home Living                      Prior Function            PT Goals (current goals can now be found in the care plan section) Acute Rehab PT Goals PT Goal Formulation: With patient Time For Goal Achievement: 09/24/14 Potential  to Achieve Goals: Good Progress towards PT goals: Progressing toward goals    Frequency  Min 2X/week    PT Plan Discharge plan needs to be updated;Frequency needs to be updated    Co-evaluation             End of Session Equipment Utilized During Treatment: Gait belt Activity Tolerance: Patient tolerated treatment well Patient left: with call bell/phone within  reach;in chair     Time: 1521-1555 (- 5 minutes non therapeutic while pt toileting) PT Time Calculation (min): 34 min  Charges:  $Gait Training: 8-22 mins $Therapeutic Activity: 8-22 mins                    G Codes:       [employer] Isabel Freese, [place] [phone]  Berton MountBarbour, Kadejah Sandiford S 09/13/2014, 4:31 PM

## 2014-09-13 NOTE — Plan of Care (Signed)
Problem: Phase II Progression Outcomes Goal: Progress activity as tolerated unless otherwise ordered Outcome: Progressing     

## 2014-09-13 NOTE — Plan of Care (Signed)
Problem: Phase II Progression Outcomes Goal: Obtain order to discontinue catheter if appropriate Outcome: Not Applicable Date Met:  86/76/72

## 2014-09-13 NOTE — Progress Notes (Signed)
Patient ID: Jim BergeronWilliam Shields, male   DOB: [birth year], 80 y.o.   MRN: [id number] 4 Days Post-Op  Subjective: Pt very HOH.  No abdominal complaints.  Having BMs.  Tolerating clear liquids well.  No nausea.  Spat up thick yellow-green sputum while I was in the room.  Objective: Vital signs in last 24 hours: Temp:  [98 F (36.7 C)-101.2 F (38.4 C)] 98.8 F (37.1 C) (11/03 0830) Pulse Rate:  [90-91] 91 (11/03 0830) Resp:  [17-18] 17 (11/03 0830) BP: (112-147)/(63-78) 112/63 mmHg (11/03 0830) SpO2:  [95 %-100 %] 95 % (11/03 0830) Last BM Date: 09/12/14  Intake/Output from previous day: 11/02 0701 - 11/03 0700 In: 1470 [P.O.:1470] Out: 250 [Urine:250] Intake/Output this shift:    PE: Abd: soft, +BS, appropriately tender, incision c/d/i with staples present.  Lab Results:   Recent Labs  09/11/14 0500 09/12/14 0424  WBC 6.1 4.7  HGB 14.5 13.9  HCT 43.0 40.7  PLT 141* 139*   BMET  Recent Labs  09/12/14 0424 09/13/14 0411  NA 148* 145  K 3.6* 3.0*  CL 111 109  CO2 19 23  GLUCOSE 110* 153*  BUN 27* 17  CREATININE 1.33 1.31  CALCIUM 8.0* 7.8*   PT/INR No results for input(s): LABPROT, INR in the last 72 hours. CMP     Component Value Date/Time   NA 145 09/13/2014 0411   K 3.0* 09/13/2014 0411   CL 109 09/13/2014 0411   CO2 23 09/13/2014 0411   GLUCOSE 153* 09/13/2014 0411   BUN 17 09/13/2014 0411   CREATININE 1.31 09/13/2014 0411   CALCIUM 7.8* 09/13/2014 0411   PROT 7.7 09/05/2014 1528   ALBUMIN 3.9 09/05/2014 1528   AST 18 09/05/2014 1528   ALT 11 09/05/2014 1528   ALKPHOS 105 09/05/2014 1528   BILITOT 0.7 09/05/2014 1528   GFRNONAA 50* 09/13/2014 0411   GFRAA 58* 09/13/2014 0411   Lipase     Component Value Date/Time   LIPASE 31 09/05/2014 1528       Studies/Results: No results found.  Anti-infectives: Anti-infectives    Start     Dose/Rate Route Frequency Ordered Stop   09/09/14 0945  [MAR Hold]  cefOXitin (MEFOXIN) 2 g in dextrose 5 %  50 mL IVPB     (MAR Hold since 09/09/14 1541)   2 g100 mL/hr over 30 Minutes Intravenous On call to O.R. 09/09/14 0944 09/09/14 1645       Assessment/Plan  Bowel obstruction EXPLORATORY LAPAROTOMY, LYSIS OF ADHESION, Jim SchmidtJay Wyatt, MD, 09/09/2014, POD 4  Hypertension COPD/hx of tobacco use Lingular nodule AODM Type II Hypokalemia/dehydration Post op ileus Hard of hearing  Plan: 1. Patient looks well from a surgical standpoint.  Will advance him to full liquids. 2. Patient ran a fever over night of 101.2.  He is having thick yellow-green sputum he is spitting up.  Will defer to medicine, but may need CXR to rule out pulmonary process 3. Cont to mobilize and pulm toilet.  LOS: 8 days    Jim Shields E 09/13/2014, 9:31 AM Pager: [phone]

## 2014-09-13 NOTE — Progress Notes (Signed)
09/13/2014 11:48 PM The patient ambulated about 100 feet in the hallway with his front wheel walker and the nurse beside him. The patient tolerated the activity well. Will continue to monitor the patient. Jim Shields, Keiji Melland E

## 2014-09-14 DIAGNOSIS — E876 Hypokalemia: Secondary | ICD-10-CM | POA: Insufficient documentation

## 2014-09-14 DIAGNOSIS — I10 Essential (primary) hypertension: Secondary | ICD-10-CM | POA: Insufficient documentation

## 2014-09-14 DIAGNOSIS — I4729 Other ventricular tachycardia: Secondary | ICD-10-CM | POA: Insufficient documentation

## 2014-09-14 DIAGNOSIS — J189 Pneumonia, unspecified organism: Secondary | ICD-10-CM

## 2014-09-14 DIAGNOSIS — I472 Ventricular tachycardia: Secondary | ICD-10-CM

## 2014-09-14 LAB — BASIC METABOLIC PANEL
Anion gap: 12 (ref 5–15)
BUN: 13 mg/dL (ref 6–23)
CALCIUM: 8.1 mg/dL — AB (ref 8.4–10.5)
CO2: 24 meq/L (ref 19–32)
Chloride: 107 mEq/L (ref 96–112)
Creatinine, Ser: 1.34 mg/dL (ref 0.50–1.35)
GFR calc Af Amer: 56 mL/min — ABNORMAL LOW (ref >90)
GFR calc non Af Amer: 48 mL/min — ABNORMAL LOW (ref >90)
GLUCOSE: 137 mg/dL — AB (ref 70–99)
Potassium: 3.3 mEq/L — ABNORMAL LOW (ref 3.7–5.3)
Sodium: 143 mEq/L (ref 137–147)

## 2014-09-14 LAB — GLUCOSE, CAPILLARY
GLUCOSE-CAPILLARY: 114 mg/dL — AB (ref 70–99)
GLUCOSE-CAPILLARY: 193 mg/dL — AB (ref 70–99)
Glucose-Capillary: 131 mg/dL — ABNORMAL HIGH (ref 70–99)
Glucose-Capillary: 141 mg/dL — ABNORMAL HIGH (ref 70–99)
Glucose-Capillary: 257 mg/dL — ABNORMAL HIGH (ref 70–99)

## 2014-09-14 LAB — CBC
HEMATOCRIT: 40.3 % (ref 39.0–52.0)
HEMOGLOBIN: 13.9 g/dL (ref 13.0–17.0)
MCH: 29.2 pg (ref 26.0–34.0)
MCHC: 34.5 g/dL (ref 30.0–36.0)
MCV: 84.7 fL (ref 78.0–100.0)
Platelets: 156 10*3/uL (ref 150–400)
RBC: 4.76 MIL/uL (ref 4.22–5.81)
RDW: 14.2 % (ref 11.5–15.5)
WBC: 5.3 10*3/uL (ref 4.0–10.5)

## 2014-09-14 LAB — TSH: TSH: 0.932 u[IU]/mL (ref 0.350–4.500)

## 2014-09-14 LAB — MAGNESIUM: Magnesium: 1.8 mg/dL (ref 1.5–2.5)

## 2014-09-14 MED ORDER — ACETAMINOPHEN 325 MG PO TABS
650.0000 mg | ORAL_TABLET | Freq: Four times a day (QID) | ORAL | Status: DC | PRN
  Administered 2014-09-14: 650 mg via ORAL
  Filled 2014-09-14: qty 2

## 2014-09-14 MED ORDER — LEVOFLOXACIN 750 MG PO TABS
750.0000 mg | ORAL_TABLET | ORAL | Status: DC
  Administered 2014-09-14 – 2014-09-16 (×2): 750 mg via ORAL
  Filled 2014-09-14 (×2): qty 1

## 2014-09-14 MED ORDER — MAGNESIUM SULFATE IN D5W 10-5 MG/ML-% IV SOLN
1.0000 g | Freq: Once | INTRAVENOUS | Status: AC
  Administered 2014-09-14: 1 g via INTRAVENOUS
  Filled 2014-09-14: qty 100

## 2014-09-14 MED ORDER — INSULIN ASPART 100 UNIT/ML ~~LOC~~ SOLN
0.0000 [IU] | Freq: Three times a day (TID) | SUBCUTANEOUS | Status: DC
  Administered 2014-09-14: 1 [IU] via SUBCUTANEOUS
  Administered 2014-09-14 – 2014-09-15 (×4): 2 [IU] via SUBCUTANEOUS
  Administered 2014-09-16: 1 [IU] via SUBCUTANEOUS

## 2014-09-14 MED ORDER — INSULIN ASPART 100 UNIT/ML ~~LOC~~ SOLN: 0.0000 [IU] | Freq: Three times a day (TID) | SUBCUTANEOUS | Status: DC

## 2014-09-14 NOTE — Progress Notes (Addendum)
Patient ID: Jim Shields, male   DOB: 24-Jun-1931, 80 y.o.   MRN: [id number]  [facility] PROGRESS NOTE  Jim Shields:[id number] DOB: 24-Jun-1931 DOA: 09/05/2014 [facility]  Brief narrative: 80 yo male with COPD, HTN, presented with main concern of several days duration of progressively worsening generalized abd pain associated with nausea and non bloody vomiting, poor oral intake. CT abd c/q pSBO. Course after surgery prolonged due to fever, awaiting return of bowel function.  Will need SNF placement  Assessment and Plan:     SBO (small bowel obstruction) Repeat CT shows high grade obstruction.  S/p OR for ex lap, loa- 10/30 - NG Tube came out 11/1  -tolerating diet-being advanced by surgery. Patient having BMs.  Fever on 11/2/ Possible HCAP - No Blood cultures drawn. CXR suggested bibasal atx vs infiltrates. - Patient gives h/o continued cough productive of green sputum. Will start PO Levofloxacin for possible PNA/acute bronchitis and complete total 8 days treatment. Will need repeat chest x-ray in 4-6 weeks to ensure resolution of pneumonia findings.  Essential HTN (hypertension) PO metoprolol. Controlled.  COPD (chronic obstructive pulmonary disease) Stable. Abx as above.  7 mm lingular nodule - noted on CT chest - pt with history of tobacco use - recommendation is to have repeat CT chest at 3-6 months to ensure stability   DM type II - A1C 7.5 this admission Controlled on lantus/SSI  Hypokalemia Replete & follow.  AKI- Peak troponin 1.75. Improved and creatinine plateaued at 1.3.  Hypernatremia: encourage free water, resolved  NSVT - monitor on tele. Replace K to > 4 and Mg > 2. Check 2 D Echo and TSH. Continue BB's.  Code Status: Full Family Communication: none at bedside. Disposition Plan: SNF when stable  Medical Consultants:   Surgery    HPI/Subjective: Productive cough without SOB or CP. Tolerating  diet and having BM.  Objective: Filed Vitals:   09/13/14 1357 09/13/14 2135 09/14/14 0505 09/14/14 1326  BP: 134/70 137/65 121/71 130/62  Pulse: 87 86 88 85  Temp: 98.9 F (37.2 C) 98.6 F (37 C) 98.7 F (37.1 C) 98.4 F (36.9 C)  TempSrc: Oral Oral Oral Oral  Resp: 17 18 18 17   Height:      Weight:      SpO2: 96% 99% 96% 95%    Intake/Output Summary (Last 24 hours) at 09/14/14 1507 Last data filed at 09/14/14 1300  Gross per 24 hour  Intake    920 ml  Output      0 ml  Net    920 ml    Exam:   General:  Awake, NAD. Sitting comfortably on chair.  Cardiovascular: Regular rate and rhythm, no rubs, no gallops. No JVD or pedal edema. Telemetry: Sinus rhythm. Patient had 13 beat NSVT on 11/3 at 1:11 PM and 9 beat NSVT on 11/2 at 2:34 AM.  Respiratory: harsh breath sounds bilaterally with no rhonchi or wheezing. Occasional basal crackles. No increased work of breathing.   Abdomen:  Nondistended, soft and nontender. Normal bowel sounds heard. Laparotomy dressing clean and dry.   CNS: Alert and oriented. No focal deficits.   Data Reviewed: Basic Metabolic Panel:  Recent Labs Lab 09/10/14 0943 09/11/14 0500 09/12/14 0424 09/13/14 0411 09/14/14 0500  NA 143 147 148* 145 143  K 3.4* 3.6* 3.6* 3.0* 3.3*  CL 103 107 111 109 107  CO2 23 20 19 23 24   GLUCOSE 117* 139* 110* 153* 137*  BUN 41* 34* 27* 17 13  CREATININE 1.75* 1.55* 1.33 1.31 1.34  CALCIUM 7.5* 8.1* 8.0* 7.8* 8.1*  MG  --   --   --   --  1.8   Liver Function Tests: No results for input(s): AST, ALT, ALKPHOS, BILITOT, PROT, ALBUMIN in the last 168 hours. No results for input(s): LIPASE, AMYLASE in the last 168 hours. CBC:  Recent Labs Lab 09/09/14 0923 09/10/14 0943 09/11/14 0500 09/12/14 0424 09/14/14 0500  WBC 4.0 6.1 6.1 4.7 5.3  NEUTROABS  --   --  4.1  --   --   HGB 15.6 14.7 14.5 13.9 13.9  HCT 45.4 42.7 43.0 40.7 40.3  MCV 86.5 86.8 87.2 85.7 84.7  PLT 148* 128* 141* 139* 156    CBG:  Recent Labs Lab 09/13/14 2055 09/13/14 2347 09/14/14 0406 09/14/14 0805 09/14/14 1136  GLUCAP 112* 173* 131* 114* 193*      Jim Bokhari, MD, FACP, FHM. [facility] Pager [phone]  If 7PM-7AM, please contact night-coverage [email or web address] Password [email or web address] 09/14/2014, 3:25 PM  LOS: 9 days  Time: 30 minutes.

## 2014-09-14 NOTE — Clinical Social Work Psychosocial (Signed)
Clinical Social Work Department BRIEF PSYCHOSOCIAL ASSESSMENT 09/14/2014  Patient:  Jim Shields,Jim Shields     Account Number:  [id number]     Admit date:  09/05/2014  Clinical Social Worker:  Elouise MunroeANTERHAUS,Jeret Goyer, LCSWA  Date/Time:  09/13/2014 02:33 PM  Referred by:  Physician  Date Referred:  09/13/2014 Referred for  SNF Placement   Other Referral:   Interview type:  Patient Other interview type:    PSYCHOSOCIAL DATA Living Status:  WIFE Admitted from facility:   Level of care:   Primary support name:  Jim HickLisa Shields Primary support relationship to patient:  SPOUSE Degree of support available:   Patient's wife is supportive, however she does feel that her husband needs to go to a SNF for some rehab.    CURRENT CONCERNS  Other Concerns:    SOCIAL WORK ASSESSMENT / PLAN Patient is a married [age] year old male who lives at home with his wife.  Patient is very hard of hearing, and could not hear very well.  Patient is alert and oriented x3. Patient and wife are in agreement for patient to discharge to SNF once he is medically ready and discharge orders are received.   Assessment/plan status:   Other assessment/ plan:   Information/referral to community resources:    PATIENT'S/FAMILY'S RESPONSE TO PLAN OF CARE: Patient and family in agreement to plan to discharge to SNF for rehab once patient is medically ready.   Ervin KnackEric R. Devaney Segers, MSW, LCSWA [phone] 09/14/2014 11:23 AM

## 2014-09-14 NOTE — Progress Notes (Signed)
[facility] Progress Note  5 Days Post-Op  Subjective: Pt doing well from surgical standpoint.  Not much pain.  No N/V, tolerating full liquids well.  Ambulating OOB, up to chair.  Landlord at bedside says his wife who he's separated from cleared out his bank account.  Objective: Vital signs in last 24 hours: Temp:  [98.6 F (37 C)-98.9 F (37.2 C)] 98.7 F (37.1 C) (11/04 0505) Pulse Rate:  [86-98] 88 (11/04 0505) Resp:  [17-18] 18 (11/04 0505) BP: (121-137)/(65-71) 121/71 mmHg (11/04 0505) SpO2:  [96 %-99 %] 96 % (11/04 0505) Last BM Date: 09/13/14  Intake/Output from previous day: 11/03 0701 - 11/04 0700 In: 800 [P.O.:800] Out: 300 [Urine:300] Intake/Output this shift:    PE: Gen:  Alert, NAD, pleasant Abd: Soft, NT/ND, +BS, no HSM, incisions C/D/I with staples in place  Lab Results:   Recent Labs  09/12/14 0424 09/14/14 0500  WBC 4.7 5.3  HGB 13.9 13.9  HCT 40.7 40.3  PLT 139* 156   BMET  Recent Labs  09/13/14 0411 09/14/14 0500  NA 145 143  K 3.0* 3.3*  CL 109 107  CO2 23 24  GLUCOSE 153* 137*  BUN 17 13  CREATININE 1.31 1.34  CALCIUM 7.8* 8.1*   PT/INR No results for input(s): LABPROT, INR in the last 72 hours. CMP     Component Value Date/Time   NA 143 09/14/2014 0500   K 3.3* 09/14/2014 0500   CL 107 09/14/2014 0500   CO2 24 09/14/2014 0500   GLUCOSE 137* 09/14/2014 0500   BUN 13 09/14/2014 0500   CREATININE 1.34 09/14/2014 0500   CALCIUM 8.1* 09/14/2014 0500   PROT 7.7 09/05/2014 1528   ALBUMIN 3.9 09/05/2014 1528   AST 18 09/05/2014 1528   ALT 11 09/05/2014 1528   ALKPHOS 105 09/05/2014 1528   BILITOT 0.7 09/05/2014 1528   GFRNONAA 48* 09/14/2014 0500   GFRAA 56* 09/14/2014 0500   Lipase     Component Value Date/Time   LIPASE 31 09/05/2014 1528       Studies/Results: Dg Chest 2 View  09/13/2014   CLINICAL DATA:  80 year old male with high-grade small bowel obstruction with laparotomy and lysed adhesions on  09/09/2014.  EXAM: CHEST  2 VIEW  COMPARISON:  09/09/2014 and prior radiographs.  FINDINGS: Cardiomegaly noted.  Bilateral lower lung atelectasis/ airspace disease noted.  Pneumoperitoneum underlying the right hemidiaphragm is noted. This likely is postsurgical but consider follow-up as clinically indicated.  There is no evidence of pneumothorax or large pleural effusion.  IMPRESSION: Bilateral lower lung opacities compatible with atelectasis and/or airspace disease. Aspiration or pneumonia not excluded.  Pneumoperitoneum - most likely postsurgical but consider follow-up as clinically indicated.   Electronically Signed   By: Laveda AbbeJeff  Hu M.D.   On: 09/13/2014 13:33    Anti-infectives: Anti-infectives    Start     Dose/Rate Route Frequency Ordered Stop   09/14/14 1000  levofloxacin (LEVAQUIN) tablet 750 mg     750 mg Oral Every 48 hours 09/14/14 0949 09/22/14 0959   09/09/14 0945  [MAR Hold]  cefOXitin (MEFOXIN) 2 g in dextrose 5 % 50 mL IVPB     (MAR Hold since 09/09/14 1541)   2 g100 mL/hr over 30 Minutes Intravenous On call to O.R. 09/09/14 0944 09/09/14 1645       Assessment/Plan Bowel obstruction POD #5 s/p EXPLORATORY LAPAROTOMY, LYSIS OF ADHESION, Frederik SchmidtJay Wyatt, MD, 09/09/2014 Hypertension COPD/hx of tobacco use Lingular nodule AODM Type II  Hypokalemia/dehydration Post op ileus Hard of hearing  Plan: 1. Patient looks well from a surgical standpoint. Will advance him to soft diet.  He can go home tomorrow if medically stable and tolerating solid diet. 2. No more fevers, no leukocytosis.  He was having thick yellow-green sputum. Will defer to medicine, CXR showed b/l lower lung opacities compatabile with atelectasis and/or airspace disease.  Aspiration/pneumonia not excluded.  They've started him on levofloxacin for PNA. 3. Cont to mobilize and pulm toilet. 4. SNF at d/c per PT 5. Staples need to be removed POD #10, and post-op check with Dr. Lindie SpruceWyatt in 2-3 weeks.  The office should call  with appt date/time.    LOS: 9 days    DORT, Aundra MilletMEGAN 09/14/2014, 9:52 AM Pager: [phone]

## 2014-09-14 NOTE — Discharge Instructions (Signed)
[facility]      [facility] [phone]  OPEN ABDOMINAL SURGERY: POST OP INSTRUCTIONS  Always review your discharge instruction sheet given to you by the facility where your surgery was performed.  IF YOU HAVE DISABILITY OR FAMILY LEAVE FORMS, YOU MUST BRING THEM TO THE OFFICE FOR PROCESSING.  PLEASE DO NOT GIVE THEM TO YOUR DOCTOR.  1. A prescription for pain medication may be given to you upon discharge.  Take your pain medication as prescribed, if needed.  If narcotic pain medicine is not needed, then you may take acetaminophen (Tylenol) or ibuprofen (Advil) as needed. 2. Take your usually prescribed medications unless otherwise directed. 3. If you need a refill on your pain medication, please contact your pharmacy. They will contact our office to request authorization.  Prescriptions will not be filled after 5pm or on week-ends. 4. You should follow a light diet the first few days after arrival home, such as soup and crackers, pudding, etc.unless your doctor has advised otherwise. A high-fiber, low fat diet can be resumed as tolerated.   Be sure to include lots of fluids daily. Most patients will experience some swelling and bruising on the chest and neck area.  Ice packs will help.  Swelling and bruising can take several days to resolve 5. Most patients will experience some swelling and bruising in the area of the incision. Ice pack will help. Swelling and bruising can take several days to resolve..  6. It is common to experience some constipation if taking pain medication after surgery.  Increasing fluid intake and taking a stool softener will usually help or prevent this problem from occurring.  A mild laxative (Milk of Magnesia or Miralax) should be taken according to package directions if there are no bowel movements after 48 hours. 7.  You may have steri-strips (small skin tapes) in place directly over the incision.  These strips should be left on the skin for 7-10 days.  If your  surgeon used skin glue on the incision, you may shower in 24 hours.  The glue will flake off over the next 2-3 weeks.  Any sutures or staples will be removed at the office during your follow-up visit. You may find that a light gauze bandage over your incision may keep your staples from being rubbed or pulled. You may shower and replace the bandage daily. 8. ACTIVITIES:  You may resume regular (light) daily activities beginning the next day--such as daily self-care, walking, climbing stairs--gradually increasing activities as tolerated.  You may have sexual intercourse when it is comfortable.  Refrain from any heavy lifting or straining until approved by your doctor. a. You may drive when you no longer are taking prescription pain medication, you can comfortably wear a seatbelt, and you can safely maneuver your car and apply brakes b. Return to Work: ___________________________________ 9. You should see your doctor in the office for a follow-up appointment approximately two weeks after your surgery.  Make sure that you call for this appointment within a day or two after you arrive home to insure a convenient appointment time. OTHER INSTRUCTIONS:  _____________________________________________________________ _____________________________________________________________  WHEN TO CALL YOUR DOCTOR: 1. Fever over 101.0 2. Inability to urinate 3. Nausea and/or vomiting 4. Extreme swelling or bruising 5. Continued bleeding from incision. 6. Increased pain, redness, or drainage from the incision. 7. Difficulty swallowing or breathing 8. Muscle cramping or spasms. 9. Numbness or tingling in hands or feet or around lips.  The clinic staff is available to   answer your questions during regular business hours.  Please don't hesitate to call and ask to speak to one of the nurses if you have concerns.  For further questions, please visit [email or web address]   

## 2014-09-14 NOTE — Clinical Social Work Placement (Addendum)
Clinical Social Work Department CLINICAL SOCIAL WORK PLACEMENT NOTE 09/14/2014  Patient:  Jim Shields,Jim Shields  Account Number:  [id number] Admit date:  09/05/2014  Clinical Social Worker:  Harvy Riera, LCSWA  Date/time:  09/13/2014 02:40 PM  Clinical Social Work is seeking post-discharge placement for this patient at the following level of care:   SKILLED NURSING   (*CSW will update this form in Epic as items are completed)   09/14/2014  Patient/family provided with [facility] of Clinical Social Work's list of facilities offering this level of care within the geographic area requested by the patient (or if unable, by the patient's family).  09/14/2014  Patient/family informed of their freedom to choose among providers that offer the needed level of care, that participate in Medicare, Medicaid or managed care program needed by the patient, have an available bed and are willing to accept the patient.  09/14/2014  Patient/family informed of [facility]' ownership interest in [facility], as well as of the fact that they are under no obligation to receive care at this facility.  PASARR submitted to EDS on 09/13/2014 PASARR number received on 09/13/2014  FL2 transmitted to all facilities in geographic area requested by pt/family on  09/14/2014 FL2 transmitted to all facilities within larger geographic area on 09/14/2014  Patient informed that his/her managed care company has contracts with or will negotiate with  certain facilities, including the following:     Patient/family informed of bed offers received: 09/14/14   [facility]  Physician recommends and patient chooses bed at    Patient to be transferred to  on  09/16/14 Patient to be transferred to facility by [facility] Patient and family notified of transfer on 09/16/14 Name of friend notified:  Mitzi HansenLavaughn Shields [phone]  The following physician request were entered in  Epic:   Additional Comments:  Patient requests to take his spouse off contact list  Ervin Knackric R. Rj Pedrosa, MSW, Theresia MajorsLCSWA [phone] 09/16/2014 4:48 PM

## 2014-09-15 ENCOUNTER — Encounter (HOSPITAL_COMMUNITY): Payer: Self-pay | Admitting: Cardiology

## 2014-09-15 DIAGNOSIS — I420 Dilated cardiomyopathy: Secondary | ICD-10-CM

## 2014-09-15 DIAGNOSIS — I059 Rheumatic mitral valve disease, unspecified: Secondary | ICD-10-CM

## 2014-09-15 DIAGNOSIS — J449 Chronic obstructive pulmonary disease, unspecified: Secondary | ICD-10-CM | POA: Insufficient documentation

## 2014-09-15 LAB — GLUCOSE, CAPILLARY
GLUCOSE-CAPILLARY: 172 mg/dL — AB (ref 70–99)
Glucose-Capillary: 186 mg/dL — ABNORMAL HIGH (ref 70–99)
Glucose-Capillary: 167 mg/dL — ABNORMAL HIGH (ref 70–99)
Glucose-Capillary: 134 mg/dL — ABNORMAL HIGH (ref 70–99)

## 2014-09-15 LAB — BASIC METABOLIC PANEL
Anion gap: 10 (ref 5–15)
BUN: 10 mg/dL (ref 6–23)
CHLORIDE: 107 meq/L (ref 96–112)
CO2: 26 meq/L (ref 19–32)
Calcium: 7.9 mg/dL — ABNORMAL LOW (ref 8.4–10.5)
Creatinine, Ser: 1.24 mg/dL (ref 0.50–1.35)
GFR calc Af Amer: 62 mL/min — ABNORMAL LOW (ref >90)
GFR, EST NON AFRICAN AMERICAN: 53 mL/min — AB (ref >90)
GLUCOSE: 178 mg/dL — AB (ref 70–99)
POTASSIUM: 4.1 meq/L (ref 3.7–5.3)
SODIUM: 143 meq/L (ref 137–147)

## 2014-09-15 LAB — MAGNESIUM: MAGNESIUM: 1.9 mg/dL (ref 1.5–2.5)

## 2014-09-15 MED ORDER — METOPROLOL SUCCINATE ER 25 MG PO TB24
25.0000 mg | ORAL_TABLET | Freq: Once | ORAL | Status: AC
  Administered 2014-09-15: 25 mg via ORAL
  Filled 2014-09-15: qty 1

## 2014-09-15 MED ORDER — MAGNESIUM SULFATE 2 GM/50ML IV SOLN
2.0000 g | Freq: Once | INTRAVENOUS | Status: AC
  Administered 2014-09-15: 2 g via INTRAVENOUS
  Filled 2014-09-15: qty 50

## 2014-09-15 MED ORDER — METOPROLOL SUCCINATE ER 50 MG PO TB24
50.0000 mg | ORAL_TABLET | Freq: Every day | ORAL | Status: DC
  Administered 2014-09-16: 50 mg via ORAL
  Filled 2014-09-15: qty 1

## 2014-09-15 MED ORDER — PERFLUTREN LIPID MICROSPHERE
1.0000 mL | INTRAVENOUS | Status: AC | PRN
  Administered 2014-09-15: 2 mL via INTRAVENOUS
  Filled 2014-09-15: qty 10

## 2014-09-15 NOTE — Progress Notes (Signed)
[facility] Progress Note  6 Days Post-Op  Subjective: Pt looks good.  No N/V, tolerating diet well.  Ambulating OOB, currently sitting up in the chair.  Staples in place.  Having flatus and BM's.  Objective: Vital signs in last 24 hours: Temp:  [97.7 F (36.5 C)-98.4 F (36.9 C)] 97.7 F (36.5 C) (11/05 0349) Pulse Rate:  [79-91] 79 (11/05 0349) Resp:  [17] 17 (11/05 0349) BP: (118-138)/(62-78) 138/78 mmHg (11/05 0349) SpO2:  [95 %-97 %] 96 % (11/05 0824) Weight:  [162 lb 6.4 oz (73.664 kg)] 162 lb 6.4 oz (73.664 kg) (11/05 0349) Last BM Date: 09/13/14  Intake/Output from previous day: 11/04 0701 - 11/05 0700 In: 1200 [P.O.:1200] Out: 550 [Urine:550] Intake/Output this shift: Total I/O In: 240 [P.O.:240] Out: -   PE: Gen:  Alert, NAD, pleasant Abd: Soft, NT, mild distension, +BS, no HSM, incisions C/D/I with staples in place   Lab Results:   Recent Labs  09/14/14 0500  WBC 5.3  HGB 13.9  HCT 40.3  PLT 156   BMET  Recent Labs  09/14/14 0500 09/15/14 0815  NA 143 143  K 3.3* 4.1  CL 107 107  CO2 24 26  GLUCOSE 137* 178*  BUN 13 10  CREATININE 1.34 1.24  CALCIUM 8.1* 7.9*   PT/INR No results for input(s): LABPROT, INR in the last 72 hours. CMP     Component Value Date/Time   NA 143 09/15/2014 0815   K 4.1 09/15/2014 0815   CL 107 09/15/2014 0815   CO2 26 09/15/2014 0815   GLUCOSE 178* 09/15/2014 0815   BUN 10 09/15/2014 0815   CREATININE 1.24 09/15/2014 0815   CALCIUM 7.9* 09/15/2014 0815   PROT 7.7 09/05/2014 1528   ALBUMIN 3.9 09/05/2014 1528   AST 18 09/05/2014 1528   ALT 11 09/05/2014 1528   ALKPHOS 105 09/05/2014 1528   BILITOT 0.7 09/05/2014 1528   GFRNONAA 53* 09/15/2014 0815   GFRAA 62* 09/15/2014 0815   Lipase     Component Value Date/Time   LIPASE 31 09/05/2014 1528       Studies/Results: Dg Chest 2 View  09/13/2014   CLINICAL DATA:  80 year old male with high-grade small bowel obstruction with laparotomy  and lysed adhesions on 09/09/2014.  EXAM: CHEST  2 VIEW  COMPARISON:  09/09/2014 and prior radiographs.  FINDINGS: Cardiomegaly noted.  Bilateral lower lung atelectasis/ airspace disease noted.  Pneumoperitoneum underlying the right hemidiaphragm is noted. This likely is postsurgical but consider follow-up as clinically indicated.  There is no evidence of pneumothorax or large pleural effusion.  IMPRESSION: Bilateral lower lung opacities compatible with atelectasis and/or airspace disease. Aspiration or pneumonia not excluded.  Pneumoperitoneum - most likely postsurgical but consider follow-up as clinically indicated.   Electronically Signed   By: Laveda AbbeJeff  Hu M.D.   On: 09/13/2014 13:33    Anti-infectives: Anti-infectives    Start     Dose/Rate Route Frequency Ordered Stop   09/14/14 1000  levofloxacin (LEVAQUIN) tablet 750 mg     750 mg Oral Every 48 hours 09/14/14 0949 09/22/14 0959   09/09/14 0945  [MAR Hold]  cefOXitin (MEFOXIN) 2 g in dextrose 5 % 50 mL IVPB     (MAR Hold since 09/09/14 1541)   2 g100 mL/hr over 30 Minutes Intravenous On call to O.R. 09/09/14 0944 09/09/14 1645       Assessment/Plan Bowel obstruction POD #6 s/p EXPLORATORY LAPAROTOMY, LYSIS OF ADHESION, Frederik SchmidtJay Wyatt, MD, 09/09/2014 HTN COPD/hx of  tobacco use Lingular nodule AODM Type II Hypokalemia/dehydration Post op ileus - resolved Hard of hearing NSVT - discharge pending improvement in this  Plan: 1. Patient looks well from a surgical standpoint. Tolerating soft diet.  He can go home today if medically stable and tolerating solid diet. 2. No more fevers, no leukocytosis. Medicine treating for PNA, they've started him on levofloxacin for PNA. 3. Cont to mobilize and pulm toilet. 4. SNF at d/c per PT 5. Staples to be removed on Monday in office, f/u appt's under discharge section of epic 6. Will sign off, call with questions/concerns.      LOS: 10 days    Aris GeorgiaDORT, Anadelia Kintz 09/15/2014, 10:41 AM Pager: [phone]

## 2014-09-15 NOTE — Progress Notes (Signed)
Patient ID: Jim Shields, male   DOB: 1931-03-16, 80 y.o.   MRN: [id number]  [facility] PROGRESS NOTE  Jim Shields [id number] DOB: 1931-03-16 DOA: 09/05/2014 [facility]  Brief narrative: 80 yo male with COPD, HTN, presented with main concern of several days duration of progressively worsening generalized abd pain associated with nausea and non bloody vomiting, poor oral intake. CT abd c/q pSBO. Course after surgery prolonged due to fever, awaiting return of bowel function.  Will need SNF placement  Assessment and Plan:     SBO (small bowel obstruction) Repeat CT shows high grade obstruction.  S/p OR for ex lap, loa- 10/30 - NG Tube came out 11/1  -tolerating diet-being advanced by surgery. Patient having BMs.  Fever on 11/2/ Possible HCAP - No Blood cultures drawn. CXR suggested bibasal atx vs infiltrates. - Patient gave h/o continued cough productive of green sputum. Started PO Levofloxacin for possible PNA/acute bronchitis and complete total 8 days treatment. Will need repeat chest x-ray in 4-6 weeks to ensure resolution of pneumonia findings. - Symptomatically better.  Essential HTN (hypertension) PO metoprolol. Controlled. Increased Metoprolol for NSVT  COPD (chronic obstructive pulmonary disease) Stable. Abx as above.  7 mm lingular nodule - noted on CT chest - pt with history of tobacco use - recommendation is to have repeat CT chest at 3-6 months to ensure stability   DM type II - A1C 7.5 this admission Controlled on lantus/SSI  Hypokalemia Replaced  AKI- Peak troponin 1.75. Improved and creatinine continues to improve  Hypernatremia: encourage free water, resolved  NSVT/cardiomyopathy - patient continued to have paroxysmal asymptomatic NSVT and longest was 13 beats. Potassium replaced: 4.1. Magnesium 1.9. Cardiology was consulted and have increased Toprol-XL to 50 MG daily. Continue monitoring on  telemetry. 2-D echo shows mild LVH, LVEF 40-45 percent, diffuse LV hypokinesis, grade 1 diastolic dysfunction. We will consider starting low-dose ACE inhibitor's. Further management as per cardiology.  Code Status: Full Family Communication: none at bedside. Disposition Plan: SNF when stable  Medical Consultants:   Surgery  cardiology   HPI/Subjective: States that cough is better. Denied chest pain, dyspnea or palpitations. Denied abdominal pain. Tolerating soft diet.  Objective: Filed Vitals:   09/15/14 0349 09/15/14 0824 09/15/14 1106 09/15/14 1300  BP: 138/78  146/79 156/81  Pulse: 79  79 77  Temp: 97.7 F (36.5 C)   98 F (36.7 C)  TempSrc: Oral   Oral  Resp: 17   18  Height: 5\' 10"  (1.778 m)     Weight: 73.664 kg (162 lb 6.4 oz)     SpO2: 97% 96%  97%    Intake/Output Summary (Last 24 hours) at 09/15/14 1817 Last data filed at 09/15/14 1300  Gross per 24 hour  Intake    720 ml  Output    300 ml  Net    420 ml    Exam:   General:  Awake, NAD. Sitting comfortably on chair and seen eating breakfast this morning.  Cardiovascular: Regular rate and rhythm, no rubs, no gallops. No JVD or pedal edema. Telemetry: Sinus rhythm. Patient had 2 runs of NSVT (4-5 beats).  Respiratory: clear to auscultation. No increased work of breathing.   Abdomen:  Nondistended, soft and nontender. Normal bowel sounds heard. Laparotomy dressing clean and dry.   CNS: Alert and oriented. No focal deficits.   Data Reviewed: Basic Metabolic Panel:  Recent Labs Lab 09/11/14 0500 09/12/14 0424 09/13/14 0411 09/14/14 0500 09/15/14 [id number]  NA 147 148* 145 143 143  K 3.6* 3.6* 3.0* 3.3* 4.1  CL 107 111 109 107 107  CO2 20 19 23 24 26   GLUCOSE 139* 110* 153* 137* 178*  BUN 34* 27* 17 13 10   CREATININE 1.55* 1.33 1.31 1.34 1.24  CALCIUM 8.1* 8.0* 7.8* 8.1* 7.9*  MG  --   --   --  1.8 1.9   Liver Function Tests: No results for input(s): AST, ALT, ALKPHOS, BILITOT, PROT, ALBUMIN in  the last 168 hours. No results for input(s): LIPASE, AMYLASE in the last 168 hours. CBC:  Recent Labs Lab 09/09/14 0923 09/10/14 0943 09/11/14 0500 09/12/14 0424 09/14/14 0500  WBC 4.0 6.1 6.1 4.7 5.3  NEUTROABS  --   --  4.1  --   --   HGB 15.6 14.7 14.5 13.9 13.9  HCT 45.4 42.7 43.0 40.7 40.3  MCV 86.5 86.8 87.2 85.7 84.7  PLT 148* 128* 141* 139* 156   CBG:  Recent Labs Lab 09/14/14 1630 09/14/14 2121 09/15/14 0543 09/15/14 1123 09/15/14 1641  GLUCAP 141* 257* 186* 172* 167*      Laveyah Oriol, MD, FACP, FHM. [facility] Pager [phone]  If 7PM-7AM, please contact night-coverage [email or web address] Password [email or web address] 09/15/2014, 6:17 PM  LOS: 10 days  Time: 30 minutes.

## 2014-09-15 NOTE — Consult Note (Signed)
Reason for Consult: NSVT   Referring Physician: Dr. Algis Liming PCP:  Default, Provider, MD Primary Cardiologist new  Jim Shields is an 80 y.o. male.    Chief Complaint: admitted 09/05/14 with abd pain   HPI: 80 yo male with COPD, HTN, presented 09/05/14 with several weeks of waxing/waning abd pain with n/v that has worsened in the last couple of days prior to admit. No fevers. No diarrhea. Vomit no blood. Denied any cough and pain is mostly in abdomenal area but sometimes radiates into chest. No swelling in le or wt gain. Ct show psbo. He was admitted and underwent EXPLORATORY LAPAROTOMY, LYSIS OF ADHESION on 09/09/14.  Since surgery he has had fever and awaiting return of bowel function.  With fever pt now on Levofloxacin for Poss PNA/acute bronchitis.  He did develop NSVT and has had numerous episodes up to 13 beats.  K+ at times has been low.  Mg+ yesterday and today 1.8 and 1.9.   EKG SR with freq PACs and non conducted PACs.  LVH.  Other wise similar ot EKG on the 26th.  Echo is Pending. Troponin on admit is neg.  Difficult to get answers from Pt, he asks me the questions I am asking him.  He denies chest pain or past heart attack, but he believes he sees cardiologist in [facility].  He does complain of DOE some of the time, he stated he would walk up to a mile at time.   Past Medical History  Diagnosis Date  . COPD (chronic obstructive pulmonary disease)   . Hypertension   . Diabetes mellitus   . MI (myocardial infarction)   . Hyperlipidemia 01/13/2012  . HTN (hypertension) 01/13/2012  . Diabetes mellitus, type 2 01/13/2012    Past Surgical History  Procedure Laterality Date  . Cholecystectomy    . Laparotomy N/A 09/09/2014    Procedure: EXPLORATORY LAPAROTOMY;  Surgeon: Doreen Salvage, MD;  Location: [facility];  Service: General;  Laterality: N/A;  . Lysis of adhesion N/A 09/09/2014    Procedure: LYSIS OF ADHESION;  Surgeon: Doreen Salvage, MD;  Location: [facility] OR;   Service: General;  Laterality: N/A;    Family History  Problem Relation Age of Onset  . Cancer Father   . Seizures Son    Social History:  reports that he has quit smoking. He does not have any smokeless tobacco history on file. He reports that he does not drink alcohol or use illicit drugs.  Allergies: No Known Allergies  Medications Prior to Admission  Medication Sig Dispense Refill  . acetaminophen (TYLENOL) 325 MG tablet Take 650 mg by mouth every 6 (six) hours as needed.    Marland Kitchen albuterol (PROVENTIL) (2.5 MG/3ML) 0.083% nebulizer solution Take 2.5 mg by nebulization every 6 (six) hours as needed for wheezing or shortness of breath.    Marland Kitchen amLODipine (NORVASC) 5 MG tablet Take 5 mg by mouth daily.    Marland Kitchen aspirin 81 MG chewable tablet Chew 81 mg by mouth daily.    Marland Kitchen atorvastatin (LIPITOR) 40 MG tablet Take 40 mg by mouth every evening.    Marland Kitchen buPROPion (WELLBUTRIN XL) 150 MG 24 hr tablet Take 150 mg by mouth daily.    . Fluticasone-Salmeterol (ADVAIR) 250-50 MCG/DOSE AEPB Inhale 1 puff into the lungs 2 (two) times daily.    . hydrochlorothiazide (HYDRODIURIL) 25 MG tablet Take 25 mg by mouth daily.    . insulin glargine (LANTUS) 100 UNIT/ML injection Inject  20 Units into the skin every morning.     . latanoprost (XALATAN) 0.005 % ophthalmic solution Place 1 drop into both eyes at bedtime.    Marland Kitchen lisinopril (PRINIVIL,ZESTRIL) 40 MG tablet Take 40 mg by mouth daily.    . meloxicam (MOBIC) 7.5 MG tablet Take 7.5 mg by mouth daily.    . metFORMIN (GLUCOPHAGE) 1000 MG tablet Take 1,000 mg by mouth 2 (two) times daily with a meal.    . metoprolol succinate (TOPROL-XL) 25 MG 24 hr tablet Take 25 mg by mouth daily.    . Multiple Vitamin (MULITIVITAMIN WITH MINERALS) TABS Take 1 tablet by mouth daily.    Marland Kitchen tiotropium (SPIRIVA) 18 MCG inhalation capsule Place 18 mcg into inhaler and inhale daily.      Results for orders placed or performed during the hospital encounter of 09/05/14 (from the past 48  hour(s))  Glucose, capillary     Status: Abnormal   Collection Time: 09/13/14  4:06 PM  Result Value Ref Range   Glucose-Capillary 156 (H) 70 - 99 mg/dL   Comment 1 Documented in Chart    Comment 2 Notify RN   Glucose, capillary     Status: Abnormal   Collection Time: 09/13/14  8:55 PM  Result Value Ref Range   Glucose-Capillary 112 (H) 70 - 99 mg/dL  Glucose, capillary     Status: Abnormal   Collection Time: 09/13/14 11:47 PM  Result Value Ref Range   Glucose-Capillary 173 (H) 70 - 99 mg/dL   Comment 1 Notify RN   Glucose, capillary     Status: Abnormal   Collection Time: 09/14/14  4:06 AM  Result Value Ref Range   Glucose-Capillary 131 (H) 70 - 99 mg/dL   Comment 1 Notify RN   Magnesium     Status: None   Collection Time: 09/14/14  5:00 AM  Result Value Ref Range   Magnesium 1.8 1.5 - 2.5 mg/dL  Basic metabolic panel     Status: Abnormal   Collection Time: 09/14/14  5:00 AM  Result Value Ref Range   Sodium 143 137 - 147 mEq/L   Potassium 3.3 (L) 3.7 - 5.3 mEq/L   Chloride 107 96 - 112 mEq/L   CO2 24 19 - 32 mEq/L   Glucose, Bld 137 (H) 70 - 99 mg/dL   BUN 13 6 - 23 mg/dL   Creatinine, Ser 1.34 0.50 - 1.35 mg/dL   Calcium 8.1 (L) 8.4 - 10.5 mg/dL   GFR calc non Af Amer 48 (L) >90 mL/min   GFR calc Af Amer 56 (L) >90 mL/min    Comment: (NOTE) The eGFR has been calculated using the CKD EPI equation. This calculation has not been validated in all clinical situations. eGFR's persistently <90 mL/min signify possible Chronic Kidney Disease.    Anion gap 12 5 - 15  CBC     Status: None   Collection Time: 09/14/14  5:00 AM  Result Value Ref Range   WBC 5.3 4.0 - 10.5 K/uL   RBC 4.76 4.22 - 5.81 MIL/uL   Hemoglobin 13.9 13.0 - 17.0 g/dL   HCT 40.3 39.0 - 52.0 %   MCV 84.7 78.0 - 100.0 fL   MCH 29.2 26.0 - 34.0 pg   MCHC 34.5 30.0 - 36.0 g/dL   RDW 14.2 11.5 - 15.5 %   Platelets 156 150 - 400 K/uL  Glucose, capillary     Status: Abnormal   Collection Time: 09/14/14   8:05 AM  Result Value Ref Range   Glucose-Capillary 114 (H) 70 - 99 mg/dL   Comment 1 Notify RN   Glucose, capillary     Status: Abnormal   Collection Time: 09/14/14 11:36 AM  Result Value Ref Range   Glucose-Capillary 193 (H) 70 - 99 mg/dL   Comment 1 Notify RN   TSH     Status: None   Collection Time: 09/14/14  4:00 PM  Result Value Ref Range   TSH 0.932 0.350 - 4.500 uIU/mL  Glucose, capillary     Status: Abnormal   Collection Time: 09/14/14  4:30 PM  Result Value Ref Range   Glucose-Capillary 141 (H) 70 - 99 mg/dL   Comment 1 Documented in Chart    Comment 2 Notify RN   Glucose, capillary     Status: Abnormal   Collection Time: 09/14/14  9:21 PM  Result Value Ref Range   Glucose-Capillary 257 (H) 70 - 99 mg/dL  Glucose, capillary     Status: Abnormal   Collection Time: 09/15/14  5:43 AM  Result Value Ref Range   Glucose-Capillary 186 (H) 70 - 99 mg/dL  Basic metabolic panel     Status: Abnormal   Collection Time: 09/15/14  8:15 AM  Result Value Ref Range   Sodium 143 137 - 147 mEq/L   Potassium 4.1 3.7 - 5.3 mEq/L   Chloride 107 96 - 112 mEq/L   CO2 26 19 - 32 mEq/L   Glucose, Bld 178 (H) 70 - 99 mg/dL   BUN 10 6 - 23 mg/dL   Creatinine, Ser 1.24 0.50 - 1.35 mg/dL   Calcium 7.9 (L) 8.4 - 10.5 mg/dL   GFR calc non Af Amer 53 (L) >90 mL/min   GFR calc Af Amer 62 (L) >90 mL/min    Comment: (NOTE) The eGFR has been calculated using the CKD EPI equation. This calculation has not been validated in all clinical situations. eGFR's persistently <90 mL/min signify possible Chronic Kidney Disease.    Anion gap 10 5 - 15  Magnesium     Status: None   Collection Time: 09/15/14  8:15 AM  Result Value Ref Range   Magnesium 1.9 1.5 - 2.5 mg/dL  Glucose, capillary     Status: Abnormal   Collection Time: 09/15/14 11:23 AM  Result Value Ref Range   Glucose-Capillary 172 (H) 70 - 99 mg/dL   Comment 1 Notify RN    No results found.  ROS: General:no colds, + fevers since  surgery, no weight changes Skin:no rashes or ulcers HEENT:no blurred vision, no congestion CV:see HPI PUL:see HPI GI:no diarrhea- now having BMs since surgery, no indigestion GU:no hematuria, no dysuria MS:no joint pain, no claudication Neuro:no syncope, no lightheadedness Endo:+ diabetes, no thyroid disease   Blood pressure 156/81, pulse 77, temperature 98 F (36.7 C), temperature source Oral, resp. rate 18, height $RemoveBe'5\' 10"'NuebAQuQG$  (1.778 m), weight 162 lb 6.4 oz (73.664 kg), SpO2 97 %. PE: General:Pleasant affect, NAD, hard of hearing wearing bil hearing aids Skin:Warm and dry, brisk capillary refill HEENT:normocephalic, sclera clear, mucus membranes moist Neck:supple, no JVD, no bruits  Heart:S1S2 RRR without murmur, gallup, rub or click Lungs:diminshed breath sounds, congested cough, no rales, occc. rhonchi, no wheezes TGG:YIRS, non tender, + BS, do not palpate liver spleen or masses Ext:no lower ext edema, 2+ pedal pulses, 2+ radial pulses Neuro:alert and oriented X 3, MAE, follows commands, + facial symmetry    Assessment/Plan Principal Problem:   SBO (small bowel obstruction) Post op  day 6 with exp. Lap and lysis of adhesions  Active Problems:   HTN (hypertension)- mildly elevated at times.   Diabetes mellitus, type 2- stable followed by IM   COPD (chronic obstructive pulmonary disease)-+ productive cough   Hypokalemia improved with replacement    Abdominal pain, acute secondary to SBO   Nausea & vomiting- resolved   Hypernatremia-resolved    Pneumonia, organism unspecified followed by IM    Paroxysmal ventricular tachycardia- several runs- some obvious NSVT, up to run of 13 beats, pt not aware of tachycardia.  On levaquin  Qtc is 458 ms and 462 on admit without NSVT-Echo has been done not yet read. On BB, await echo results. No chest pain now.  He denies MI in the past.  Keep K+ and Mg+ stable.  Will increase BB today as well.   If chest pain develops or increasing episodes of NSVT  would proceed with nuc study.  EF 40-45%.     Veteran  Nurse Practitioner Certified [place] Pager [phone] or after 5pm or weekends call [phone] 09/15/2014, 2:27 PM     Personally seen and examined. Agree with above. His ejection fraction is in the 45% range. It is unclear whether or not he has had an old myocardial infarction. History is very challenging to obtain. Nonetheless, he is not complaining of any anginal symptoms, no chest pain, no shortness of breath.increasing Toprol-XL from 25 to 50 mg once a day. If patient complains of anginal like symptoms, one could consider further risk stratification with stress test. For now, continue with beta blocker as well as maintenance of electrolytes, potassium greater than 4, magnesium greater than 2.  We will follow with you.  Candee Furbish, MD

## 2014-09-15 NOTE — Progress Notes (Signed)
  Echocardiogram 2D Echocardiogram has been performed.  Leta JunglingCooper, Konstantin Lehnen M 09/15/2014, 8:44 AM

## 2014-09-15 NOTE — Plan of Care (Signed)
Problem: Phase II Progression Outcomes Goal: Vital signs remain stable Outcome: Completed/Met Date Met:  09/15/14     

## 2014-09-16 LAB — BASIC METABOLIC PANEL
ANION GAP: 11 (ref 5–15)
BUN: 9 mg/dL (ref 6–23)
CO2: 25 mEq/L (ref 19–32)
Calcium: 8.2 mg/dL — ABNORMAL LOW (ref 8.4–10.5)
Chloride: 106 mEq/L (ref 96–112)
Creatinine, Ser: 1.15 mg/dL (ref 0.50–1.35)
GFR, EST AFRICAN AMERICAN: 67 mL/min — AB (ref >90)
GFR, EST NON AFRICAN AMERICAN: 58 mL/min — AB (ref >90)
Glucose, Bld: 156 mg/dL — ABNORMAL HIGH (ref 70–99)
POTASSIUM: 4.9 meq/L (ref 3.7–5.3)
SODIUM: 142 meq/L (ref 137–147)

## 2014-09-16 LAB — GLUCOSE, CAPILLARY
GLUCOSE-CAPILLARY: 125 mg/dL — AB (ref 70–99)
GLUCOSE-CAPILLARY: 156 mg/dL — AB (ref 70–99)
Glucose-Capillary: 144 mg/dL — ABNORMAL HIGH (ref 70–99)

## 2014-09-16 MED ORDER — LISINOPRIL 5 MG PO TABS
5.0000 mg | ORAL_TABLET | Freq: Every day | ORAL | Status: DC
  Filled 2014-09-16: qty 1

## 2014-09-16 MED ORDER — PANTOPRAZOLE SODIUM 40 MG PO TBEC: 40.0000 mg | DELAYED_RELEASE_TABLET | Freq: Every day | ORAL | Status: AC

## 2014-09-16 MED ORDER — LEVOFLOXACIN 750 MG PO TABS: 750.0000 mg | ORAL_TABLET | ORAL | Status: DC

## 2014-09-16 MED ORDER — LISINOPRIL 5 MG PO TABS: 5.0000 mg | ORAL_TABLET | Freq: Every day | ORAL | Status: AC

## 2014-09-16 MED ORDER — ACETAMINOPHEN 325 MG PO TABS: 650.0000 mg | ORAL_TABLET | Freq: Four times a day (QID) | ORAL | Status: AC | PRN

## 2014-09-16 MED ORDER — METOPROLOL SUCCINATE ER 50 MG PO TB24: 50.0000 mg | ORAL_TABLET | Freq: Every day | ORAL | Status: AC

## 2014-09-16 MED ORDER — INSULIN GLARGINE 100 UNIT/ML ~~LOC~~ SOLN: 5.0000 [IU] | Freq: Every day | SUBCUTANEOUS | Status: AC

## 2014-09-16 NOTE — Clinical Social Work Note (Signed)
[facility]  Patient agreeable to plans will transport via ems RN to call report.  Patient requests that his wife be taken off the contact list, and to put his landlord Jim Shields, phone number [phone].  Facility made aware of the patient's request to take his spouse off the contact list.  [name], MSW, Theresia MajorsLCSWA [phone]

## 2014-09-16 NOTE — Progress Notes (Signed)
I have left a message on our office's scheduling voicemail requesting a follow-up appointment, and our office will call the patient with this appointment. Tyjay Galindo PA-C  

## 2014-09-16 NOTE — Discharge Summary (Addendum)
Physician Discharge Summary  Elma Limas ZOX:[id number] DOB: Oct 06, 1931 DOA: 09/05/2014  PCP: Gevorg Brum, MD  Admit date: 09/05/2014 Discharge date: 09/16/2014  Time spent: Greater than 30 minutes  Recommendations for Outpatient Follow-up:  1. General Surgery/[facility] on 09/19/14 at 9 AM for staple removal. 2. Dr Frederik Schmidt, General Surgery on 10/03/14 at 3:30 PM for postop follow-up. 3. Dr. Donato Schultz, Cardiology: MDs office will call patient with appointment to be seen in 1-2 weeks. Please call if you have not heard back in 3 days. 4. PCP or M.D. At SNF in 3 days with repeat labs (CBC & BMP). 5. Recommend repeat CT chest with contrast in 3-6 months to evaluate and follow pulmonary nodule. 6. Recommend repeating chest x-ray in 4-6 weeks to ensure resolution of pneumonia findings.   Discharge Diagnoses:  Principal Problem:   SBO (small bowel obstruction) Active Problems:   HTN (hypertension)   Diabetes mellitus, type 2   COPD (chronic obstructive pulmonary disease)   Hypokalemia   Abdominal pain, acute   Nausea & vomiting   Hypernatremia   Essential hypertension   Pneumonia, organism unspecified   Hypopotassemia   Paroxysmal ventricular tachycardia   COLD (chronic obstructive lung disease)   Discharge Condition: Improved & Stable  Diet recommendation: Soft diet.  Filed Weights   09/12/14 0419 09/15/14 0349 09/16/14 0437  Weight: 72.485 kg (159 lb 12.8 oz) 73.664 kg (162 lb 6.4 oz) 70.171 kg (154 lb 11.2 oz)    History of present illness:  80 year old male patient with history of COPD, hypertension, DM 2, HLD, MI, who was admitted to the hospital on 09/05/14 with complaints of nausea, vomiting and abdominal pain and CT abdomen was consistent with small bowel obstruction. Surgery was consulted.  Hospital Course:   1. Small bowel obstruction: Likely secondary to adhesions. General surgery was consulted. Initially managed conservatively with bowel rest and NG tube  decompression. He did not improve and eventually underwent exploratory laparotomy and lysis of adhesion on 09/09/14. Surgery continued to manage care. His postop ileus has resolved. His diet was gradually advanced. He is currently tolerating soft diet. He denies nausea, vomiting, abdominal pain. He is having BMs. Surgery has cleared him for discharge and have arranged for outpatient follow-up. Patient will continue on Protonix at least for a couple of weeks due to NG trauma related minimal upper GI bleed. 2. Essential hypertension 3. COPD 4. 7 mm lingular nodule: noted on CT chest. Recommend repeat CT chest in 3-6 months to ensure stability. 5. Type II DM: reasonably controlled on reduced than previous dose of Lantus. Metformin which had been held in the hospital will be resumed at discharge. Adjust regimen as needed. 6. Hypokalemia:aggressively replaced and at upper limit of normal. Since HCTZ has been discontinued, will not discharge on potassium supplements. Recommend close follow-up of BMP as outpatient. 7. Acute kidney injury, complicating possible stage II CKD: Likely secondary to poor oral intake and GI losses. Resolved. 8. Hypernatremia: Resolved 9. Febrile illness: Patient spiked a temperature of 101.2 on 11/3 which is possibly from HCAP. Patient has had no further fevers. He was treated for HCAP. 10. HCAP: complete 8 days course of levofloxacin. 11. Nonsustained ventricular tachycardia: Patient had episodes of paroxysmal nonsustained tachycardia on telemetry which were asymptomatic. This was evaluated with 2-D echo which showed new cardiomyopathy with reduced EF(40-45 percent). Cardiology was consulted. They increased dose of Toprol-XL. Potassium and magnesium were aggressively replaced. Cardiology has cleared for discharge and recommend outpatient follow-up with them. 12. Cardiomyopathy  of uncertain duration:2-D echo shows LVEF 40-45 percent with diffuse hypokinesis. Cardiology consulted.  Patient's Toprol XL was titrated up. Lisinopril which had been held secondary to acute kidney injury has been started at low dose and can be gradually titrated up to previous home dose of 40 mg daily as blood pressure tolerates and based on stability of BMP.  Consultations:  General surgery  Cardiology  Procedures:  Exploratory laparotomy and lysis of adhesions on 09/09/14  NG tube   Discharge Exam:  Complaints:  patient denies complaints. Tolerating diet and having BMs. No nausea, vomiting or abdominal pain. Cough decreased. No chest pain or dyspnea.  Filed Vitals:   09/15/14 1855 09/15/14 2112 09/16/14 0437 09/16/14 1031  BP: 143/89 137/85 142/85 123/75  Pulse: 83 84 80 74  Temp:  98.5 F (36.9 C) 98.3 F (36.8 C)   TempSrc:  Oral Oral   Resp:  18 18   Height:      Weight:   70.171 kg (154 lb 11.2 oz)   SpO2:  100% 97%      General: Awake, NAD. Sitting comfortably in bed.  Cardiovascular: Regular rate and rhythm, no rubs, no gallops. No JVD or pedal edema. Telemetry: Sinus rhythm. Patient had a 6 beat run of NSVT early this AM.  Respiratory: clear to auscultation. No increased work of breathing.   Abdomen: Nondistended, soft and nontender. Normal bowel sounds heard. Laparotomy dressing clean and dry.  CNS: Alert and oriented. No focal deficits.  Discharge Instructions      Discharge Instructions    Call MD for:  difficulty breathing, headache or visual disturbances    Complete by:  As directed      Call MD for:  persistant nausea and vomiting    Complete by:  As directed      Call MD for:  redness, tenderness, or signs of infection (pain, swelling, redness, odor or green/yellow discharge around incision site)    Complete by:  As directed      Call MD for:  severe uncontrolled pain    Complete by:  As directed      Call MD for:  temperature >100.4    Complete by:  As directed      Discharge instructions    Complete by:  As directed   Diet: Soft diet.      Increase activity slowly    Complete by:  As directed             Medication List    STOP taking these medications        amLODipine 5 MG tablet  Commonly known as:  NORVASC     hydrochlorothiazide 25 MG tablet  Commonly known as:  HYDRODIURIL     meloxicam 7.5 MG tablet  Commonly known as:  MOBIC      TAKE these medications        acetaminophen 325 MG tablet  Commonly known as:  TYLENOL  Take 2 tablets (650 mg total) by mouth every 6 (six) hours as needed for mild pain, moderate pain, fever or headache.     albuterol (2.5 MG/3ML) 0.083% nebulizer solution  Commonly known as:  PROVENTIL  Take 2.5 mg by nebulization every 6 (six) hours as needed for wheezing or shortness of breath.     aspirin 81 MG chewable tablet  Chew 81 mg by mouth daily.     atorvastatin 40 MG tablet  Commonly known as:  LIPITOR  Take 40 mg by mouth every  evening.     buPROPion 150 MG 24 hr tablet  Commonly known as:  WELLBUTRIN XL  Take 150 mg by mouth daily.     Fluticasone-Salmeterol 250-50 MCG/DOSE Aepb  Commonly known as:  ADVAIR  Inhale 1 puff into the lungs 2 (two) times daily.     insulin glargine 100 UNIT/ML injection  Commonly known as:  LANTUS  Inject 0.05 mLs (5 Units total) into the skin at bedtime.     latanoprost 0.005 % ophthalmic solution  Commonly known as:  XALATAN  Place 1 drop into both eyes at bedtime.     levofloxacin 750 MG tablet  Commonly known as:  LEVAQUIN  Take 1 tablet (750 mg total) by mouth every other day. 2 more doses. Last dose 09/20/14.  Start taking on:  09/18/2014     lisinopril 5 MG tablet  Commonly known as:  PRINIVIL,ZESTRIL  Take 1 tablet (5 mg total) by mouth daily.     metFORMIN 1000 MG tablet  Commonly known as:  GLUCOPHAGE  Take 1,000 mg by mouth 2 (two) times daily with a meal.     metoprolol succinate 50 MG 24 hr tablet  Commonly known as:  TOPROL-XL  Take 1 tablet (50 mg total) by mouth daily.     multivitamin with minerals  Tabs tablet  Take 1 tablet by mouth daily.     pantoprazole 40 MG tablet  Commonly known as:  PROTONIX  Take 1 tablet (40 mg total) by mouth daily.     tiotropium 18 MCG inhalation capsule  Commonly known as:  SPIRIVA  Place 18 mcg into inhaler and inhale daily.       Follow-up Information    Follow up with [facility] [place] On 09/19/2014.   Why:  For staple removal with Dr. Dixon Boos nurse at 9:00am, please arrive by 8:30am to check in and fill out paperwork   Contact information:   [address] [place] [address] [phone]      Follow up with Frederik Schmidt, MD On 10/03/2014.   Specialty:  General Surgery   Why:  For post-operation check on 10/03/14 @ 3:30pm, please arrive by 3:00pm to check in   Contact information:   [address], [address]  [facility] [place] [address] [phone]       Follow up with Donato Schultz, MD.   Specialty:  Cardiology   Why:  Office will call you for your followup appointment. Call office if you have not heard back in 3 days..   Contact information:   [address] [place] [address] [phone]       Follow up with PCP or MD at SNF. Schedule an appointment as soon as possible for a visit in 3 days.   Why:  To be seen with repeat labs (CBC & BMP).       The results of significant diagnostics from this hospitalization (including imaging, microbiology, ancillary and laboratory) are listed below for reference.    Significant Diagnostic Studies: Ct Abdomen Pelvis Wo Contrast  09/09/2014   CLINICAL DATA:  Abdominal pain, nausea, vomiting.  EXAM: CT ABDOMEN AND PELVIS WITHOUT CONTRAST  TECHNIQUE: Multidetector CT imaging of the abdomen and pelvis was performed following the standard protocol without IV contrast.  COMPARISON:  09/05/2014  FINDINGS: Upper normal to mildly enlarged heart size. Coronary artery calcifications. Bibasilar atelectasis and/or scarring. NG  tube descends into the proximal stomach.  Organ  evaluation is limited in the absence of intravenous contrast. Within this limitation, hepatic and splenic granuloma. A peripherally enhancing liver lesion on the recent prior is not visualized without contrast. The enhancement characteristics favors that of hemangioma. Slight central biliary ductal dilatation status post cholecystectomy. Lipoma along the pancreatic head. No adrenal lesion. Bilateral renal cysts. No hydroureteronephrosis.  Decompressed colon and distal small bowel loops. Normal appendix. Distention of proximal and mid small bowel loops up to 4 cm. Focal transition point along a kinked loop in the right hemi-abdomen (series 2, image 51).  Small amount of perihepatic and interloop fluid. No bowel wall pneumatosis. No free intraperitoneal air.  Scattered atherosclerotic disease of the aorta and branch vessels without aneurysmal dilatation.  Thin walled bladder.  Upper normal size prostate gland.  Left SI joint ankylosis.  Multilevel degenerative changes.  IMPRESSION: Small bowel obstruction has progressed, now high grade. Right hemi abdomen transition point, favor an underlying adhesion. Small amount of interloop fluid.   Electronically Signed   By: [name]  DelGaizo M.D.   On: 09/09/2014 06:49   Dg Chest 2 View  09/13/2014   CLINICAL DATA:  80 year old male with high-grade small bowel obstruction with laparotomy and lysed adhesions on 09/09/2014.  EXAM: CHEST  2 VIEW  COMPARISON:  09/09/2014 and prior radiographs.  FINDINGS: Cardiomegaly noted.  Bilateral lower lung atelectasis/ airspace disease noted.  Pneumoperitoneum underlying the right hemidiaphragm is noted. This likely is postsurgical but consider follow-up as clinically indicated.  There is no evidence of pneumothorax or large pleural effusion.  IMPRESSION: Bilateral lower lung opacities compatible with atelectasis and/or airspace disease. Aspiration or pneumonia not excluded.  Pneumoperitoneum  - most likely postsurgical but consider follow-up as clinically indicated.   Electronically Signed   By: Laveda AbbeJeff  Hu M.D.   On: 09/13/2014 13:33   Dg Chest 2 View  09/05/2014   CLINICAL DATA:  Chest pain, history of COPD  EXAM: CHEST  2 VIEW  COMPARISON:  01/15/2012  FINDINGS: Heart size upper normal to mildly enlarged. Mild interstitial coarsening. Mild right greater than left lung base opacity. Mild scattered aortic atherosclerosis. No definite pleural effusion. No pneumothorax. Mild multilevel degenerative changes. Surgical clips project over the upper abdomen.  IMPRESSION: Mild lung base opacity; atelectasis versus infiltrate.  Interstitial coarsening/peribronchial thickening may reflect COPD with acute and/or chronic bronchitis.   Electronically Signed   By: [name]  DelGaizo M.D.   On: 09/05/2014 21:30   Dg Abd 2 Views  09/09/2014   CLINICAL DATA:  Small bowel obstruction.  Abdominal distention.  EXAM: ABDOMEN - 2 VIEW  COMPARISON:  CT abdomen and pelvis 09/09/2014 and abdominal radiographs 09/08/2014  FINDINGS: No definite intraperitoneal free air is identified. Multiple gas-filled loops of dilated small bowel are again seen in the mid to upper abdomen, measuring up to 5.3 cm in diameter, similar to prior radiographs. Multiple fluid-filled small bowel loops are present lower in the abdomen. Multiple small bowel air-fluid levels are seen. There is a paucity of colonic gas. Enteric tube remains in place with tip projecting over the proximal stomach. Multiple surgical clips are again seen in the right upper quadrant. Thoracolumbar spondylosis is noted.  IMPRESSION: Persistent high-grade small bowel obstruction.   Electronically Signed   By: [name]  Grady   On: 09/09/2014 08:18   Dg Abd 2 Views  09/08/2014   CLINICAL DATA:  Small bowel obstruction  EXAM: ABDOMEN - 2 VIEW  COMPARISON:  09/07/2014  FINDINGS: NG tube tip in the fundus of the stomach.  Dilated small bowel loops with air-fluid levels are stable.  There is no free intraperitoneal gas. Postoperative changes are noted.  IMPRESSION: Stable partial small bowel obstruction pattern. No free intraperitoneal gas.   Electronically Signed   By: Maryclare Bean M.D.   On: 09/08/2014 12:05   Dg Abd Acute W/chest  09/07/2014   CLINICAL DATA:  Bowel obstruction  EXAM: ACUTE ABDOMEN SERIES (ABDOMEN 2 VIEW & CHEST 1 VIEW)  COMPARISON:  09/05/2014  FINDINGS: Cardiac shadow is mildly enlarged. No focal infiltrate or sizable effusion is seen. A nasogastric catheter is noted coursing into the stomach.  Postsurgical changes are noted in the right upper quadrant. Multiple scattered loops of small bowel are again identified and stable. Degenerative changes of the lumbar spine are again seen.  IMPRESSION: Stable dilated small bowel.  Continued followup is recommended.   Electronically Signed   By: Alcide Clever M.D.   On: 09/07/2014 10:26   Dg Abd Portable 1v  09/07/2014   CLINICAL DATA:  Blood and NG tube drainage.  Evaluate placement.  EXAM: PORTABLE ABDOMEN - 1 VIEW  COMPARISON:  09/07/2014  FINDINGS: NG tube tip is in the proximal stomach with the side port near the GE junction, in stable position since earlier. Heart is mildly enlarged. No confluent opacity in the visualized lower lungs. Prior cholecystectomy. Mild distention of abdominal small bowel loops concerning for small bowel obstruction, similar to prior study.  IMPRESSION: NG tube tip remains in the proximal stomach with the side port near the GE junction, unchanged. Stable prominence of upper abdominal small bowel loops.   Electronically Signed   By: Charlett Nose M.D.   On: 09/07/2014 20:37   Ct Angio Chest Aorta W/cm &/or Wo/cm  09/05/2014   CLINICAL DATA:  Acute chest and abdominal pain.  EXAM: CT ANGIOGRAPHY CHEST, ABDOMEN AND PELVIS  TECHNIQUE: Multidetector CT imaging through the chest, abdomen and pelvis was performed using the standard protocol during bolus administration of intravenous contrast.  Multiplanar reconstructed images and MIPs were obtained and reviewed to evaluate the vascular anatomy.  CONTRAST:  100 mL of Omnipaque 350 intravenously.  COMPARISON:  CT scan of May 15, 2012.  FINDINGS: CTA CHEST FINDINGS  Mild bilateral apical scarring is noted. No pneumothorax or pleural effusion is noted. Mild emphysematous changes noted in the upper lobes bilaterally. There is no evidence of thoracic aortic dissection or aneurysm. There is no evidence of pulmonary embolus. Calcified bilateral hilar lymph nodes are noted. No other mediastinal mass or adenopathy is noted. 7 mm nodule is noted inferiorly in the left lingula. Great vessels are widely patent.  Review of the MIP images confirms the above findings.  CTA ABDOMEN AND PELVIS FINDINGS  There is no evidence of abdominal aortic aneurysm or dissection. The renal arteries are widely patent without significant stenosis. The celiac and superior mesenteric arteries are also widely patent without significant stenosis. There is moderate stenosis involving the origin of the inferior mesenteric artery. Mild atherosclerotic calcifications of the abdominal aorta and iliac arteries are noted. Status post cholecystectomy. Bilateral renal cysts are again noted and unchanged compared to prior exam. Small amount of free fluid is noted around the liver and spleen. No significant abnormality is noted in the liver, spleen or pancreas. The appendix appears normal. Urinary bladder appears normal. No significant adenopathy is noted. Moderate to severe proximal small bowel dilatation is noted with possible transition zone seen in the central portion of the abdomen anteriorly best seen on image number 178  of series 5. This is concerning for possible adhesion.  Review of the MIP images confirms the above findings.  IMPRESSION: No evidence of thoracic or abdominal aortic dissection or aneurysm.  No evidence of pulmonary embolus.  Moderate to severe proximal small bowel dilatation is  noted with possible transition zone seen in the central portion of the abdomen. This is concerning for possible adhesion.  7 mm lingular nodule is noted. If the patient is at high risk for bronchogenic carcinoma, follow-up chest CT at 3-386months is recommended. If the patient is at low risk for bronchogenic carcinoma, follow-up chest CT at 6-12 months is recommended. This recommendation follows the consensus statement: Guidelines for Management of Small Pulmonary Nodules Detected on CT Scans: A Statement from the Fleischner Society as published in Radiology 2005; 237:395-400.   Electronically Signed   By: Roque LiasJames  Green M.D.   On: 09/05/2014 22:31   Ct Angio Abd/pel W/ And/or W/o  09/05/2014   CLINICAL DATA:  Acute chest and abdominal pain.  EXAM: CT ANGIOGRAPHY CHEST, ABDOMEN AND PELVIS  TECHNIQUE: Multidetector CT imaging through the chest, abdomen and pelvis was performed using the standard protocol during bolus administration of intravenous contrast. Multiplanar reconstructed images and MIPs were obtained and reviewed to evaluate the vascular anatomy.  CONTRAST:  100 mL of Omnipaque 350 intravenously.  COMPARISON:  CT scan of May 15, 2012.  FINDINGS: CTA CHEST FINDINGS  Mild bilateral apical scarring is noted. No pneumothorax or pleural effusion is noted. Mild emphysematous changes noted in the upper lobes bilaterally. There is no evidence of thoracic aortic dissection or aneurysm. There is no evidence of pulmonary embolus. Calcified bilateral hilar lymph nodes are noted. No other mediastinal mass or adenopathy is noted. 7 mm nodule is noted inferiorly in the left lingula. Great vessels are widely patent.  Review of the MIP images confirms the above findings.  CTA ABDOMEN AND PELVIS FINDINGS  There is no evidence of abdominal aortic aneurysm or dissection. The renal arteries are widely patent without significant stenosis. The celiac and superior mesenteric arteries are also widely patent without significant  stenosis. There is moderate stenosis involving the origin of the inferior mesenteric artery. Mild atherosclerotic calcifications of the abdominal aorta and iliac arteries are noted. Status post cholecystectomy. Bilateral renal cysts are again noted and unchanged compared to prior exam. Small amount of free fluid is noted around the liver and spleen. No significant abnormality is noted in the liver, spleen or pancreas. The appendix appears normal. Urinary bladder appears normal. No significant adenopathy is noted. Moderate to severe proximal small bowel dilatation is noted with possible transition zone seen in the central portion of the abdomen anteriorly best seen on image number 178 of series 5. This is concerning for possible adhesion.  Review of the MIP images confirms the above findings.  IMPRESSION: No evidence of thoracic or abdominal aortic dissection or aneurysm.  No evidence of pulmonary embolus.  Moderate to severe proximal small bowel dilatation is noted with possible transition zone seen in the central portion of the abdomen. This is concerning for possible adhesion.  7 mm lingular nodule is noted. If the patient is at high risk for bronchogenic carcinoma, follow-up chest CT at 3-476months is recommended. If the patient is at low risk for bronchogenic carcinoma, follow-up chest CT at 6-12 months is recommended. This recommendation follows the consensus statement: Guidelines for Management of Small Pulmonary Nodules Detected on CT Scans: A Statement from the Fleischner Society as published in Radiology 2005; 237:395-400.  Electronically Signed   By: Roque Lias M.D.   On: 09/05/2014 22:31    Microbiology: No results found for this or any previous visit (from the past 240 hour(s)).   Labs  BMET 09/16/14: Sodium 142, potassium 4.9, chloride 106, bicarbonate 25, BUN 9, creatinine 1.15.    CBC Latest Ref Rng 09/14/2014 09/12/2014 09/11/2014  WBC 4.0 - 10.5 K/uL 5.3 4.7 6.1  Hemoglobin 13.0 - 17.0  g/dL 16.1 09.6 04.5  Hematocrit 39.0 - 52.0 % 40.3 40.7 43.0  Platelets 150 - 400 K/uL 156 139(L) 141(L)    CBG (last 3)   Recent Labs  09/15/14 2115 09/16/14 0606 09/16/14 1120  GLUCAP 134* 144* 125*      Additional labs: 1. TSH: 0.932 2. A1C: 7.6 3. 2-D echo 09/15/14: Study Conclusions  - Left ventricle: The cavity size was normal. Wall thickness was increased in a pattern of mild LVH. Systolic function was mildly to moderately reduced. The estimated ejection fraction was in the range of 40% to 45%. Diffuse hypokinesis. Doppler parameters are consistent with abnormal left ventricular relaxation (grade 1 diastolic dysfunction). - Mitral valve: There was mild regurgitation. - Pulmonary arteries: Systolic pressure was mildly to moderately increased. PA peak pressure: 44 mm Hg (S).   Signed:  Marcellus Scott, MD, FACP, FHM. [facility] Pager [phone]  If 7PM-7AM, please contact night-coverage [email or web address] Password [email or web address] 09/16/2014, 3:24 PM

## 2014-09-16 NOTE — Clinical Social Work Note (Addendum)
Patient was informed that the physician does not think he will be discharging today.  Informed [facility] today, but they can still take him tomorrow, once discharge orders are received.  Ervin KnackEric R. Eathan Groman, MSW, Theresia MajorsLCSWA [phone] 09/16/2014 8:55 AM

## 2014-09-16 NOTE — Progress Notes (Addendum)
Patient: Jim Shields / Admit Date: 09/05/2014 / Date of Encounter: 09/16/2014, 11:59 AM   Subjective: Still with cough. Denies CP, SOB, nausea, vomiting, or h/o syncope.  Objective: Telemetry: NSR, occasional PACs/PVCs, 6 beats NSVT, mild sinus pauses overnight (<2sec) Physical Exam: Blood pressure 123/75, pulse 74, temperature 98.3 F (36.8 C), temperature source Oral, resp. rate 18, height 5\' 10"  (1.778 m), weight 70.171 kg (154 lb 11.2 oz), SpO2 97 %. General: Well developed, well nourished AAM in no acute distress. Head: Normocephalic, atraumatic, sclera non-icteric, no xanthomas, nares are without discharge. Neck: JVP not elevated. Lungs: Rhonchorous bilaterally to auscultation without wheezing. Breathing is unlabored. Heart: RRR S1 S2 without murmurs, rubs, or gallops.  Abdomen: Soft, non-tender, non-distended with normoactive bowel sounds. No rebound/guarding. Extremities: No clubbing or cyanosis. No edema. Distal pedal pulses are 2+ and equal bilaterally. Neuro: Alert and oriented X 3. Moves all extremities spontaneously. Psych:  Responds to questions appropriately with a normal affect.   Intake/Output Summary (Last 24 hours) at 09/16/14 1159 Last data filed at 09/16/14 0439  Gross per 24 hour  Intake    240 ml  Output    250 ml  Net    -10 ml    Inpatient Medications:  . enoxaparin (LOVENOX) injection  40 mg Subcutaneous Q24H  . insulin aspart  0-9 Units Subcutaneous TID WC  . insulin glargine  5 Units Subcutaneous QHS  . latanoprost  1 drop Both Eyes QHS  . levofloxacin  750 mg Oral Q48H  . metoprolol succinate  50 mg Oral Daily  . pantoprazole  40 mg Oral BID  . pneumococcal 23 valent vaccine  0.5 mL Intramuscular Tomorrow-1000  . potassium chloride  40 mEq Oral TID  . tiotropium  18 mcg Inhalation Daily   Infusions:    Labs:  Recent Labs  09/14/14 0500 09/15/14 0815  NA 143 143  K 3.3* 4.1  CL 107 107  CO2 24 26  GLUCOSE 137* 178*  BUN 13 10    CREATININE 1.34 1.24  CALCIUM 8.1* 7.9*  MG 1.8 1.9   No results for input(s): AST, ALT, ALKPHOS, BILITOT, PROT, ALBUMIN in the last 72 hours.  Recent Labs  09/14/14 0500  WBC 5.3  HGB 13.9  HCT 40.3  MCV 84.7  PLT 156   No results for input(s): CKTOTAL, CKMB, TROPONINI in the last 72 hours. Invalid input(s): POCBNP No results for input(s): HGBA1C in the last 72 hours.   Radiology/Studies:  Ct Abdomen Pelvis Wo Contrast  09/09/2014   CLINICAL DATA:  Abdominal pain, nausea, vomiting.  EXAM: CT ABDOMEN AND PELVIS WITHOUT CONTRAST  TECHNIQUE: Multidetector CT imaging of the abdomen and pelvis was performed following the standard protocol without IV contrast.  COMPARISON:  09/05/2014  FINDINGS: Upper normal to mildly enlarged heart size. Coronary artery calcifications. Bibasilar atelectasis and/or scarring. NG tube descends into the proximal stomach.  Organ evaluation is limited in the absence of intravenous contrast. Within this limitation, hepatic and splenic granuloma. A peripherally enhancing liver lesion on the recent prior is not visualized without contrast. The enhancement characteristics favors that of hemangioma. Slight central biliary ductal dilatation status post cholecystectomy. Lipoma along the pancreatic head. No adrenal lesion. Bilateral renal cysts. No hydroureteronephrosis.  Decompressed colon and distal small bowel loops. Normal appendix. Distention of proximal and mid small bowel loops up to 4 cm. Focal transition point along a kinked loop in the right hemi-abdomen (series 2, image 51).  Small amount of perihepatic and interloop fluid.  No bowel wall pneumatosis. No free intraperitoneal air.  Scattered atherosclerotic disease of the aorta and branch vessels without aneurysmal dilatation.  Thin walled bladder.  Upper normal size prostate gland.  Left SI joint ankylosis.  Multilevel degenerative changes.  IMPRESSION: Small bowel obstruction has progressed, now high grade. Right  hemi abdomen transition point, favor an underlying adhesion. Small amount of interloop fluid.   Electronically Signed   By: [name]  DelGaizo M.D.   On: 09/09/2014 06:49   Dg Chest 2 View  09/13/2014   CLINICAL DATA:  80 year old male with high-grade small bowel obstruction with laparotomy and lysed adhesions on 09/09/2014.  EXAM: CHEST  2 VIEW  COMPARISON:  09/09/2014 and prior radiographs.  FINDINGS: Cardiomegaly noted.  Bilateral lower lung atelectasis/ airspace disease noted.  Pneumoperitoneum underlying the right hemidiaphragm is noted. This likely is postsurgical but consider follow-up as clinically indicated.  There is no evidence of pneumothorax or large pleural effusion.  IMPRESSION: Bilateral lower lung opacities compatible with atelectasis and/or airspace disease. Aspiration or pneumonia not excluded.  Pneumoperitoneum - most likely postsurgical but consider follow-up as clinically indicated.   Electronically Signed   By: Laveda AbbeJeff  Hu M.D.   On: 09/13/2014 13:33   Dg Chest 2 View  09/05/2014   CLINICAL DATA:  Chest pain, history of COPD  EXAM: CHEST  2 VIEW  COMPARISON:  01/15/2012  FINDINGS: Heart size upper normal to mildly enlarged. Mild interstitial coarsening. Mild right greater than left lung base opacity. Mild scattered aortic atherosclerosis. No definite pleural effusion. No pneumothorax. Mild multilevel degenerative changes. Surgical clips project over the upper abdomen.  IMPRESSION: Mild lung base opacity; atelectasis versus infiltrate.  Interstitial coarsening/peribronchial thickening may reflect COPD with acute and/or chronic bronchitis.   Electronically Signed   By: [name]  DelGaizo M.D.   On: 09/05/2014 21:30   Dg Abd 2 Views  09/09/2014   CLINICAL DATA:  Small bowel obstruction.  Abdominal distention.  EXAM: ABDOMEN - 2 VIEW  COMPARISON:  CT abdomen and pelvis 09/09/2014 and abdominal radiographs 09/08/2014  FINDINGS: No definite intraperitoneal free air is identified. Multiple  gas-filled loops of dilated small bowel are again seen in the mid to upper abdomen, measuring up to 5.3 cm in diameter, similar to prior radiographs. Multiple fluid-filled small bowel loops are present lower in the abdomen. Multiple small bowel air-fluid levels are seen. There is a paucity of colonic gas. Enteric tube remains in place with tip projecting over the proximal stomach. Multiple surgical clips are again seen in the right upper quadrant. Thoracolumbar spondylosis is noted.  IMPRESSION: Persistent high-grade small bowel obstruction.   Electronically Signed   By: [name]  Grady   On: 09/09/2014 08:18   Dg Abd 2 Views  09/08/2014   CLINICAL DATA:  Small bowel obstruction  EXAM: ABDOMEN - 2 VIEW  COMPARISON:  09/07/2014  FINDINGS: NG tube tip in the fundus of the stomach. Dilated small bowel loops with air-fluid levels are stable. There is no free intraperitoneal gas. Postoperative changes are noted.  IMPRESSION: Stable partial small bowel obstruction pattern. No free intraperitoneal gas.   Electronically Signed   By: [name]  Hoss M.D.   On: 09/08/2014 12:05   Dg Abd Acute W/chest  09/07/2014   CLINICAL DATA:  Bowel obstruction  EXAM: ACUTE ABDOMEN SERIES (ABDOMEN 2 VIEW & CHEST 1 VIEW)  COMPARISON:  09/05/2014  FINDINGS: Cardiac shadow is mildly enlarged. No focal infiltrate or sizable effusion is seen. A nasogastric catheter is noted coursing into the stomach.  Postsurgical changes are noted in the right upper quadrant. Multiple scattered loops of small bowel are again identified and stable. Degenerative changes of the lumbar spine are again seen.  IMPRESSION: Stable dilated small bowel.  Continued followup is recommended.   Electronically Signed   By: Alcide Clever M.D.   On: 09/07/2014 10:26   Dg Abd Portable 1v  09/07/2014   CLINICAL DATA:  Blood and NG tube drainage.  Evaluate placement.  EXAM: PORTABLE ABDOMEN - 1 VIEW  COMPARISON:  09/07/2014  FINDINGS: NG tube tip is in the proximal stomach with  the side port near the GE junction, in stable position since earlier. Heart is mildly enlarged. No confluent opacity in the visualized lower lungs. Prior cholecystectomy. Mild distention of abdominal small bowel loops concerning for small bowel obstruction, similar to prior study.  IMPRESSION: NG tube tip remains in the proximal stomach with the side port near the GE junction, unchanged. Stable prominence of upper abdominal small bowel loops.   Electronically Signed   By: Charlett Nose M.D.   On: 09/07/2014 20:37   Ct Angio Chest Aorta W/cm &/or Wo/cm  09/05/2014   CLINICAL DATA:  Acute chest and abdominal pain.  EXAM: CT ANGIOGRAPHY CHEST, ABDOMEN AND PELVIS  TECHNIQUE: Multidetector CT imaging through the chest, abdomen and pelvis was performed using the standard protocol during bolus administration of intravenous contrast. Multiplanar reconstructed images and MIPs were obtained and reviewed to evaluate the vascular anatomy.  CONTRAST:  100 mL of Omnipaque 350 intravenously.  COMPARISON:  CT scan of May 15, 2012.  FINDINGS: CTA CHEST FINDINGS  Mild bilateral apical scarring is noted. No pneumothorax or pleural effusion is noted. Mild emphysematous changes noted in the upper lobes bilaterally. There is no evidence of thoracic aortic dissection or aneurysm. There is no evidence of pulmonary embolus. Calcified bilateral hilar lymph nodes are noted. No other mediastinal mass or adenopathy is noted. 7 mm nodule is noted inferiorly in the left lingula. Great vessels are widely patent.  Review of the MIP images confirms the above findings.  CTA ABDOMEN AND PELVIS FINDINGS  There is no evidence of abdominal aortic aneurysm or dissection. The renal arteries are widely patent without significant stenosis. The celiac and superior mesenteric arteries are also widely patent without significant stenosis. There is moderate stenosis involving the origin of the inferior mesenteric artery. Mild atherosclerotic calcifications of  the abdominal aorta and iliac arteries are noted. Status post cholecystectomy. Bilateral renal cysts are again noted and unchanged compared to prior exam. Small amount of free fluid is noted around the liver and spleen. No significant abnormality is noted in the liver, spleen or pancreas. The appendix appears normal. Urinary bladder appears normal. No significant adenopathy is noted. Moderate to severe proximal small bowel dilatation is noted with possible transition zone seen in the central portion of the abdomen anteriorly best seen on image number 178 of series 5. This is concerning for possible adhesion.  Review of the MIP images confirms the above findings.  IMPRESSION: No evidence of thoracic or abdominal aortic dissection or aneurysm.  No evidence of pulmonary embolus.  Moderate to severe proximal small bowel dilatation is noted with possible transition zone seen in the central portion of the abdomen. This is concerning for possible adhesion.  7 mm lingular nodule is noted. If the patient is at high risk for bronchogenic carcinoma, follow-up chest CT at 3-11months is recommended. If the patient is at low risk for bronchogenic carcinoma, follow-up chest CT at  6-12 months is recommended. This recommendation follows the consensus statement: Guidelines for Management of Small Pulmonary Nodules Detected on CT Scans: A Statement from the Fleischner Society as published in Radiology 2005; 237:395-400.   Electronically Signed   By: Roque LiasJames  Green M.D.   On: 09/05/2014 22:31   Ct Angio Abd/pel W/ And/or W/o  09/05/2014   CLINICAL DATA:  Acute chest and abdominal pain.  EXAM: CT ANGIOGRAPHY CHEST, ABDOMEN AND PELVIS  TECHNIQUE: Multidetector CT imaging through the chest, abdomen and pelvis was performed using the standard protocol during bolus administration of intravenous contrast. Multiplanar reconstructed images and MIPs were obtained and reviewed to evaluate the vascular anatomy.  CONTRAST:  100 mL of Omnipaque  350 intravenously.  COMPARISON:  CT scan of May 15, 2012.  FINDINGS: CTA CHEST FINDINGS  Mild bilateral apical scarring is noted. No pneumothorax or pleural effusion is noted. Mild emphysematous changes noted in the upper lobes bilaterally. There is no evidence of thoracic aortic dissection or aneurysm. There is no evidence of pulmonary embolus. Calcified bilateral hilar lymph nodes are noted. No other mediastinal mass or adenopathy is noted. 7 mm nodule is noted inferiorly in the left lingula. Great vessels are widely patent.  Review of the MIP images confirms the above findings.  CTA ABDOMEN AND PELVIS FINDINGS  There is no evidence of abdominal aortic aneurysm or dissection. The renal arteries are widely patent without significant stenosis. The celiac and superior mesenteric arteries are also widely patent without significant stenosis. There is moderate stenosis involving the origin of the inferior mesenteric artery. Mild atherosclerotic calcifications of the abdominal aorta and iliac arteries are noted. Status post cholecystectomy. Bilateral renal cysts are again noted and unchanged compared to prior exam. Small amount of free fluid is noted around the liver and spleen. No significant abnormality is noted in the liver, spleen or pancreas. The appendix appears normal. Urinary bladder appears normal. No significant adenopathy is noted. Moderate to severe proximal small bowel dilatation is noted with possible transition zone seen in the central portion of the abdomen anteriorly best seen on image number 178 of series 5. This is concerning for possible adhesion.  Review of the MIP images confirms the above findings.  IMPRESSION: No evidence of thoracic or abdominal aortic dissection or aneurysm.  No evidence of pulmonary embolus.  Moderate to severe proximal small bowel dilatation is noted with possible transition zone seen in the central portion of the abdomen. This is concerning for possible adhesion.  7 mm  lingular nodule is noted. If the patient is at high risk for bronchogenic carcinoma, follow-up chest CT at 3-746months is recommended. If the patient is at low risk for bronchogenic carcinoma, follow-up chest CT at 6-12 months is recommended. This recommendation follows the consensus statement: Guidelines for Management of Small Pulmonary Nodules Detected on CT Scans: A Statement from the Fleischner Society as published in Radiology 2005; 237:395-400.   Electronically Signed   By: Roque LiasJames  Green M.D.   On: 09/05/2014 22:31     Assessment and Plan  80 y/o M with history of HTN, DM, COPD, uncertain cardiac hx admitted with SBO s/p ex-lap & lysis of adhesions 09/09/14, fever on 11/2 treating for possible HCAP, lingular nodule on CT needing repeat in 3-6 mos, and AKI with hypernatremia/hypokalemia. We were asked to see for NSVT on telemetry and cardiomyopathy.  1. NSVT on telemetry - lytes have been repleted. Echo with cardiomyopathy of uncertain duration.  2. Cardiomyopathy of uncertain duration - echo 09/15/14 with  mildly LVH, EF 40-45% (diffuse HK), grade 1 d/d, mild MR, PA pressure mild-mod increased. He is a poor historian and no records available on [facility]. Resume ACE  See above. 3. AKI - improved, suspect a degree of CKD II. 4. Hypokalemia - ? due to HCTZ which he is no longer on. This has been aggressively repleted stop K replacement and follow 5. Essential HTN - some of his BP's have been moderately elevated. Could consider resuming ACEI vs titrating BB further.  Outpatient f/u Dr Loraine Leriche Odis Luster

## 2014-09-19 ENCOUNTER — Encounter: Payer: Self-pay | Admitting: Internal Medicine

## 2014-09-19 ENCOUNTER — Non-Acute Institutional Stay (SKILLED_NURSING_FACILITY): Payer: Medicare Other | Admitting: Internal Medicine

## 2014-09-19 DIAGNOSIS — K5669 Other intestinal obstruction: Secondary | ICD-10-CM

## 2014-09-19 DIAGNOSIS — K56609 Unspecified intestinal obstruction, unspecified as to partial versus complete obstruction: Secondary | ICD-10-CM

## 2014-09-19 DIAGNOSIS — J189 Pneumonia, unspecified organism: Secondary | ICD-10-CM

## 2014-09-19 DIAGNOSIS — I472 Ventricular tachycardia, unspecified: Secondary | ICD-10-CM

## 2014-09-19 DIAGNOSIS — J439 Emphysema, unspecified: Secondary | ICD-10-CM

## 2014-09-19 DIAGNOSIS — I429 Cardiomyopathy, unspecified: Secondary | ICD-10-CM | POA: Insufficient documentation

## 2014-09-19 DIAGNOSIS — E119 Type 2 diabetes mellitus without complications: Secondary | ICD-10-CM

## 2014-09-19 DIAGNOSIS — I4729 Other ventricular tachycardia: Secondary | ICD-10-CM

## 2014-09-19 DIAGNOSIS — R911 Solitary pulmonary nodule: Secondary | ICD-10-CM

## 2014-09-19 DIAGNOSIS — I10 Essential (primary) hypertension: Secondary | ICD-10-CM

## 2014-09-19 NOTE — Progress Notes (Signed)
MRN: [id number] Name: Jim Shields  Sex: male Age: 80 y.o. DOB: 06-28-1931  [facility] #: [facility] Facility/Room: 311 Level Of Care: SNF Provider: Merrilee SeashoreALEXANDER, Mical Brun D Emergency Contacts: Extended Emergency Contact Information Primary Emergency Contact: Wiley,Lisa Address: [address]           [place], [place] [address] [place] Home Phone: [phone] Mobile Phone: [phone] Relation: Spouse  Code Status:   Allergies: Review of patient's allergies indicates no known allergies.  Chief Complaint  Patient presents with  . New Admit To SNF    HPI: Patient is 80 y.o. male who is admitted to SNF after being hospitalized for pNA.  Past Medical History  Diagnosis Date  . COPD (chronic obstructive pulmonary disease)   . Hypertension   . Diabetes mellitus   . MI (myocardial infarction)   . Hyperlipidemia 01/13/2012  . HTN (hypertension) 01/13/2012  . Diabetes mellitus, type 2 01/13/2012    Past Surgical History  Procedure Laterality Date  . Cholecystectomy    . Laparotomy N/A 09/09/2014    Procedure: EXPLORATORY LAPAROTOMY;  Surgeon: Frederik SchmidtJay Wyatt, MD;  [facility];  Service: General;  Laterality: N/A;  . Lysis of adhesion N/A 09/09/2014    Procedure: LYSIS OF ADHESION;  Surgeon: Frederik SchmidtJay Wyatt, MD;  Location: [facility] OR;  Service: General;  Laterality: N/A;      Medication List       This list is accurate as of: 09/19/14  6:32 PM.  Always use your most recent med list.               acetaminophen 325 MG tablet  Commonly known as:  TYLENOL  Take 2 tablets (650 mg total) by mouth every 6 (six) hours as needed for mild pain, moderate pain, fever or headache.     albuterol (2.5 MG/3ML) 0.083% nebulizer solution  Commonly known as:  PROVENTIL  Take 2.5 mg by nebulization every 6 (six) hours as needed for wheezing or shortness of breath.     aspirin 81 MG chewable tablet  Chew 81 mg by mouth daily.     atorvastatin 40 MG tablet  Commonly known as:  LIPITOR  Take  40 mg by mouth every evening.     buPROPion 150 MG 24 hr tablet  Commonly known as:  WELLBUTRIN XL  Take 150 mg by mouth daily.     Fluticasone-Salmeterol 250-50 MCG/DOSE Aepb  Commonly known as:  ADVAIR  Inhale 1 puff into the lungs 2 (two) times daily.     insulin glargine 100 UNIT/ML injection  Commonly known as:  LANTUS  Inject 0.05 mLs (5 Units total) into the skin at bedtime.     latanoprost 0.005 % ophthalmic solution  Commonly known as:  XALATAN  Place 1 drop into both eyes at bedtime.     levofloxacin 750 MG tablet  Commonly known as:  LEVAQUIN  Take 1 tablet (750 mg total) by mouth every other day. 2 more doses. Last dose 09/20/14.     lisinopril 5 MG tablet  Commonly known as:  PRINIVIL,ZESTRIL  Take 1 tablet (5 mg total) by mouth daily.     metFORMIN 1000 MG tablet  Commonly known as:  GLUCOPHAGE  Take 1,000 mg by mouth 2 (two) times daily with a meal.     metoprolol succinate 50 MG 24 hr tablet  Commonly known as:  TOPROL-XL  Take 1 tablet (50 mg total) by mouth daily.     multivitamin with minerals Tabs tablet  Take  1 tablet by mouth daily.     pantoprazole 40 MG tablet  Commonly known as:  PROTONIX  Take 1 tablet (40 mg total) by mouth daily.     tiotropium 18 MCG inhalation capsule  Commonly known as:  SPIRIVA  Place 18 mcg into inhaler and inhale daily.        No orders of the defined types were placed in this encounter.    There is no immunization history for the selected administration types on file for this patient.  History  Substance Use Topics  . Smoking status: Former Games developermoker  . Smokeless tobacco: Not on file  . Alcohol Use: No    Family history is noncontributory    Review of Systems  DATA OBTAINED: from patient GENERAL:  no fevers, fatigue, appetite changes SKIN: No itching, rash or wounds EYES: No eye pain, redness, discharge EARS: No earache, tinnitus, change in hearing NOSE: No congestion, drainage or bleeding   MOUTH/THROAT: No mouth or tooth pain, No sore throat RESPIRATORY: No cough, wheezing, SOB CARDIAC: No chest pain, palpitations, lower extremity edema  GI: No abdominal pain, No N/V/D or constipation, No heartburn or reflux  GU: No dysuria, frequency or urgency, or incontinence  MUSCULOSKELETAL: No unrelieved bone/joint pain NEUROLOGIC: No headache, dizziness or focal weakness PSYCHIATRIC: No overt anxiety or sadness, No behavior issue.   Filed Vitals:   09/19/14 1817  BP: 159/95  Pulse: 68  Temp: 97 F (36.1 C)  Resp: 20    Physical Exam  GENERAL APPEARANCE: Alert, conversant,  No acute distress.  SKIN: No diaphoresis rash HEAD: Normocephalic, atraumatic  EYES: Conjunctiva/lids clear. Pupils round, reactive. EOMs intact.  EARS: External exam WNL, canals clear. Hearing grossly normal.  NOSE: No deformity or discharge.  MOUTH/THROAT: Lips w/o lesions  RESPIRATORY: Breathing is even, unlabored. Lung sounds are clear   CARDIOVASCULAR: Heart RRR no murmurs, rubs or gallops. No peripheral edema.   GASTROINTESTINAL: Abdomen is soft, non-tender, not distended w/ normal bowel sounds.;laparotomy incision GENITOURINARY: Bladder non tender, not distended  MUSCULOSKELETAL: No abnormal joints or musculature NEUROLOGIC:  Cranial nerves 2-12 grossly intact. Moves all extremities  PSYCHIATRIC: Mood and affect appropriate to situation, no behavioral issues  Patient Active Problem List   Diagnosis Date Noted  . Lung nodule 09/19/2014  . Cardiomyopathy 09/19/2014  . COLD (chronic obstructive lung disease)   . Essential hypertension   . Pneumonia, organism unspecified   . Hypopotassemia   . Paroxysmal ventricular tachycardia   . Hypernatremia 09/10/2014  . SBO (small bowel obstruction) 09/05/2014  . Abdominal pain, acute 09/05/2014  . Nausea & vomiting 09/05/2014  . Hypokalemia 10/27/2013  . COPD (chronic obstructive pulmonary disease) 01/18/2012  . Preventative health care 01/18/2012   . COPD exacerbation 01/13/2012  . HTN (hypertension) 01/13/2012  . Diabetes mellitus, type 2 01/13/2012  . Hyperlipidemia 01/13/2012    CBC    Component Value Date/Time   WBC 5.3 09/14/2014 0500   RBC 4.76 09/14/2014 0500   HGB 13.9 09/14/2014 0500   HCT 40.3 09/14/2014 0500   PLT 156 09/14/2014 0500   MCV 84.7 09/14/2014 0500   LYMPHSABS 0.6* 09/11/2014 0500   MONOABS 1.2* 09/11/2014 0500   EOSABS 0.2 09/11/2014 0500   BASOSABS 0.0 09/11/2014 0500    CMP     Component Value Date/Time   NA 142 09/16/2014 1245   K 4.9 09/16/2014 1245   CL 106 09/16/2014 1245   CO2 25 09/16/2014 1245   GLUCOSE 156* 09/16/2014 1245  BUN 9 09/16/2014 1245   CREATININE 1.15 09/16/2014 1245   CALCIUM 8.2* 09/16/2014 1245   PROT 7.7 09/05/2014 1528   ALBUMIN 3.9 09/05/2014 1528   AST 18 09/05/2014 1528   ALT 11 09/05/2014 1528   ALKPHOS 105 09/05/2014 1528   BILITOT 0.7 09/05/2014 1528   GFRNONAA 58* 09/16/2014 1245   GFRAA 67* 09/16/2014 1245    Assessment and Plan  SBO (small bowel obstruction) 1. Likely secondary to adhesions. General surgery was consulted. Initially managed conservatively with bowel rest and NG tube decompression. He did not improve and eventually underwent exploratory laparotomy and lysis of adhesion on 09/09/14. Surgery continued to manage care. His postop ileus has resolved. His diet was gradually advanced. He is currently tolerating soft diet. He denies nausea, vomiting, abdominal pain. He is having BMs. Surgery has cleared him for discharge and have arranged for outpatient follow-up. Patient will continue on Protonix at least for a couple of weeks due to NG trauma related minimal upper GI bleed  Lung nodule 2. 7 mm lingular nodule: noted on CT chest. Recommend repeat CT chest in 3-6 months to ensure stability  Diabetes mellitus, type 2 A1c 7.6 ;reasonably controlled on reduced than previous dose of Lantus. Metformin which had been held in the hospital will be  resumed at discharge. Adjust regimen as needed.  Pneumonia, organism unspecified 3. complete 8 days course of levofloxacin  Paroxysmal ventricular tachycardia 4. : Patient had episodes of paroxysmal nonsustained tachycardia on telemetry which were asymptomatic. This was evaluated with 2-D echo which showed new cardiomyopathy with reduced EF(40-45 percent). Cardiology was consulted. They increased dose of Toprol-XL. Potassium and magnesium were aggressively replaced. Cardiology has cleared for discharge and recommend outpatient follow-up with them.  Cardiomyopathy 5. of uncertain duration:2-D echo shows LVEF 40-45 percent with diffuse hypokinesis. Cardiology consulted. Patient's Toprol XL was titrated up. Lisinopril which had been held secondary to acute kidney injury has been started at low dose and can be gradually titrated up to previous home dose of 40 mg daily as blood pressure tolerates and based on stability of BMP  Essential hypertension Continue zestril and toprol  COLD (chronic obstructive lung disease) Continuie all nebs and MDI; apparently no exacerbation    Margit Hanks, MD

## 2014-09-19 NOTE — Assessment & Plan Note (Signed)
1. : Patient had episodes of paroxysmal nonsustained tachycardia on telemetry which were asymptomatic. This was evaluated with 2-D echo which showed new cardiomyopathy with reduced EF(40-45 percent). Cardiology was consulted. They increased dose of Toprol-XL. Potassium and magnesium were aggressively replaced. Cardiology has cleared for discharge and recommend outpatient follow-up with them.

## 2014-09-19 NOTE — Assessment & Plan Note (Signed)
1. Likely secondary to adhesions. General surgery was consulted. Initially managed conservatively with bowel rest and NG tube decompression. He did not improve and eventually underwent exploratory laparotomy and lysis of adhesion on 09/09/14. Surgery continued to manage care. His postop ileus has resolved. His diet was gradually advanced. He is currently tolerating soft diet. He denies nausea, vomiting, abdominal pain. He is having BMs. Surgery has cleared him for discharge and have arranged for outpatient follow-up. Patient will continue on Protonix at least for a couple of weeks due to NG trauma related minimal upper GI bleed

## 2014-09-19 NOTE — Assessment & Plan Note (Signed)
1. 7 mm lingular nodule: noted on CT chest. Recommend repeat CT chest in 3-6 months to ensure stability

## 2014-09-19 NOTE — Assessment & Plan Note (Signed)
A1c 7.6 ;reasonably controlled on reduced than previous dose of Lantus. Metformin which had been held in the hospital will be resumed at discharge. Adjust regimen as needed.

## 2014-09-19 NOTE — Assessment & Plan Note (Signed)
Continue zestril and toprol

## 2014-09-19 NOTE — Assessment & Plan Note (Signed)
1. of uncertain duration:2-D echo shows LVEF 40-45 percent with diffuse hypokinesis. Cardiology consulted. Patient's Toprol XL was titrated up. Lisinopril which had been held secondary to acute kidney injury has been started at low dose and can be gradually titrated up to previous home dose of 40 mg daily as blood pressure tolerates and based on stability of BMP

## 2014-09-19 NOTE — Assessment & Plan Note (Signed)
1. complete 8 days course of levofloxacin

## 2014-09-19 NOTE — Assessment & Plan Note (Signed)
Continuie all nebs and MDI; apparently no exacerbation

## 2014-09-25 ENCOUNTER — Encounter: Payer: Self-pay | Admitting: Internal Medicine

## 2014-09-29 ENCOUNTER — Ambulatory Visit (INDEPENDENT_AMBULATORY_CARE_PROVIDER_SITE_OTHER): Payer: Medicare Other | Admitting: Cardiology

## 2014-09-29 ENCOUNTER — Encounter: Payer: Self-pay | Admitting: Cardiology

## 2014-09-29 VITALS — BP 132/68 | HR 74 | Ht 69.0 in | Wt 154.0 lb

## 2014-09-29 DIAGNOSIS — I10 Essential (primary) hypertension: Secondary | ICD-10-CM

## 2014-09-29 DIAGNOSIS — I472 Ventricular tachycardia: Secondary | ICD-10-CM

## 2014-09-29 DIAGNOSIS — J438 Other emphysema: Secondary | ICD-10-CM

## 2014-09-29 DIAGNOSIS — I4729 Other ventricular tachycardia: Secondary | ICD-10-CM

## 2014-09-29 DIAGNOSIS — I429 Cardiomyopathy, unspecified: Secondary | ICD-10-CM

## 2014-09-29 NOTE — Progress Notes (Signed)
[address] [place], [place]  [address] Phone: [phone] Fax:  [phone]  Date:  09/29/2014   ID:  Jim Shields, DOB 01-19-1931, MRN [id number]  PCP:  Default, Provider, MD   History of Present Illness: Jim Shields is a 80 y.o. male with COPD, hypertension who was seen previously in consultation at the hospital with several weeks of waxing/waning abdominal discomfort with nausea and vomiting, underwent exploratory laparotomy with lysis of adhesion on 09/09/14. He developed nonsustained ventricular tachycardia, numerous episodes up to 13 beats. Potassium had been low at times. Troponin negative.  Ejection fraction was 40-45% with diffuse hypokinesis with mild left ventricular hypertrophy as well as PA pressures being mild to moderately increased.  His hydrochlorothiazide was stopped and may have been the culprit for hypokalemia.   Wt Readings from Last 3 Encounters:  09/29/14 154 lb (69.854 kg)  09/16/14 154 lb 11.2 oz (70.171 kg)  04/30/14 160 lb (72.576 kg)     Past Medical History  Diagnosis Date  . COPD (chronic obstructive pulmonary disease)   . Hypertension   . Diabetes mellitus   . MI (myocardial infarction)   . Hyperlipidemia 01/13/2012  . HTN (hypertension) 01/13/2012  . Diabetes mellitus, type 2 01/13/2012    Past Surgical History  Procedure Laterality Date  . Cholecystectomy    . Laparotomy N/A 09/09/2014    Procedure: EXPLORATORY LAPAROTOMY;  Surgeon: Frederik SchmidtJay Wyatt, MD;  [facility];  Service: General;  Laterality: N/A;  . Lysis of adhesion N/A 09/09/2014    Procedure: LYSIS OF ADHESION;  Surgeon: Frederik SchmidtJay Wyatt, MD;  Location: [facility] OR;  Service: General;  Laterality: N/A;    Current Outpatient Prescriptions  Medication Sig Dispense Refill  . acetaminophen (TYLENOL) 325 MG tablet Take 2 tablets (650 mg total) by mouth every 6 (six) hours as needed for mild pain, moderate pain, fever or headache.    . albuterol (PROVENTIL) (2.5 MG/3ML)  0.083% nebulizer solution Take 2.5 mg by nebulization every 6 (six) hours as needed for wheezing or shortness of breath.    Marland Kitchen. aspirin 81 MG chewable tablet Chew 81 mg by mouth daily.    Marland Kitchen. atorvastatin (LIPITOR) 40 MG tablet Take 40 mg by mouth every evening.    Marland Kitchen. buPROPion (WELLBUTRIN XL) 150 MG 24 hr tablet Take 150 mg by mouth daily.    . Fluticasone-Salmeterol (ADVAIR) 250-50 MCG/DOSE AEPB Inhale 1 puff into the lungs 2 (two) times daily.    . insulin glargine (LANTUS) 100 UNIT/ML injection Inject 0.05 mLs (5 Units total) into the skin at bedtime.    Marland Kitchen. latanoprost (XALATAN) 0.005 % ophthalmic solution Place 1 drop into both eyes at bedtime.    Marland Kitchen. lisinopril (PRINIVIL,ZESTRIL) 5 MG tablet Take 1 tablet (5 mg total) by mouth daily.    . metFORMIN (GLUCOPHAGE) 1000 MG tablet Take 1,000 mg by mouth 2 (two) times daily with a meal.    . metoprolol succinate (TOPROL-XL) 50 MG 24 hr tablet Take 1 tablet (50 mg total) by mouth daily.    . Multiple Vitamin (MULITIVITAMIN WITH MINERALS) TABS Take 1 tablet by mouth daily.    . pantoprazole (PROTONIX) 40 MG tablet Take 1 tablet (40 mg total) by mouth daily.    Marland Kitchen. tiotropium (SPIRIVA) 18 MCG inhalation capsule Place 18 mcg into inhaler and inhale daily.     No current facility-administered medications for this visit.    Allergies:   No Known Allergies  Social History:  The patient  reports that he has quit smoking. He does not have any smokeless tobacco history on file. He reports that he does not drink alcohol or use illicit drugs.   Family History  Problem Relation Age of Onset  . Cancer Father   . Seizures Son     ROS:  Please see the history of present illness.   Denies any syncope, bleeding, chest pain, shortness of breath  All other systems reviewed and negative.   PHYSICAL EXAM: VS:  BP 132/68 mmHg  Pulse 74  Ht 5\' 9"  (1.753 m)  Wt 154 lb (69.854 kg)  BMI 22.73 kg/m2  Thin, in no acute distressCurrently in wheelchair HEENT: normal,  [place]/AT, EOMI Neck: no JVD, normal carotid upstroke, no bruit Cardiac:  normal S1, S2; RRR; soft systolic left lower sternal border murmur Lungs:  Clear but distant breath sounds auscultation bilaterally, no wheezing, rhonchi or rales Abd: soft, nontender, no hepatomegaly, no bruits Ext: no edema, 2+ distal pulses Skin: warm and dry GU: deferred Neuro: no focal abnormalities noted, AAO x 3  EKG:  None today, previously demonstrating PVC, PAC with left ventricular hypertrophy, .  Echocardiogram 09/15/14-with ejection fraction 40-45%, mild LVH, diffuse hypokinesis, grade 1 diastolic dysfunction, mild mitral regurgitation, pulmonary artery pressures mild to moderately increased.     ASSESSMENT AND PLAN:  1. Nonsustained ventricular tachycardia-doing well currently. In the hospital, after repleting potassium, discontinuing HCTZ, he did quite well. Continue with medications as above. Ejection fraction mildly reduced. Continue with ACE inhibitor, beta blocker. 2. Essential hypertension-currently well controlled. Medications reviewed. 3. Hypokalemia-repleted. Doing well. 4. Prior small bowel obstruction-doing well. Currently in rehabilitation. 5. COPD-stable. 6. We will see back on as-needed basis. He states that [place].  Signed, Donato SchultzMark Daisy Lites, MD [facility]  09/29/2014 9:13 AM

## 2014-09-29 NOTE — Patient Instructions (Signed)
The current medical regimen is effective;  continue present plan and medications.  Follow up as needed with Dr Skains. 

## 2014-09-30 ENCOUNTER — Non-Acute Institutional Stay (SKILLED_NURSING_FACILITY): Payer: Medicare Other | Admitting: Nurse Practitioner

## 2014-09-30 DIAGNOSIS — K5669 Other intestinal obstruction: Secondary | ICD-10-CM

## 2014-09-30 DIAGNOSIS — I10 Essential (primary) hypertension: Secondary | ICD-10-CM

## 2014-09-30 DIAGNOSIS — E119 Type 2 diabetes mellitus without complications: Secondary | ICD-10-CM

## 2014-09-30 DIAGNOSIS — R911 Solitary pulmonary nodule: Secondary | ICD-10-CM

## 2014-09-30 DIAGNOSIS — K56609 Unspecified intestinal obstruction, unspecified as to partial versus complete obstruction: Secondary | ICD-10-CM

## 2014-09-30 DIAGNOSIS — J189 Pneumonia, unspecified organism: Secondary | ICD-10-CM

## 2014-09-30 DIAGNOSIS — I429 Cardiomyopathy, unspecified: Secondary | ICD-10-CM

## 2014-10-09 NOTE — Progress Notes (Signed)
Patient ID: Jim BergeronWilliam Shields, male   DOB: 1931-08-18, 80 y.o.   MRN: [id number]    Nursing Home Location:  [facility]   Place of Service: SNF (31)  PCP: Default, Provider, MD  No Known Allergies  Chief Complaint  Patient presents with  . Discharge Note    HPI:  Patient is a 80 y.o. male [facility] for discharge home.Patient with history of COPD, hypertension, DM 2, HLD, MI, who was admitted to the hospital on 09/05/14 with complaints of nausea, vomiting and abdominal pain and CT abdomen was consistent with small bowel obstruction. Surgery was consulted. Initially managed conservatively with bowel rest and NG tube decompression. He did not improve and eventually underwent exploratory laparotomy and lysis of adhesion on 09/09/14. Patient continued on Protonix due to NG trauma related minimal upper GI bleed. Pt also was treated for HCAP and complete 8 days course of levofloxacin. Pt also with Nonsustained ventricular tachycardia and Cardiomyopathy followed by cardiology at this time. [facility] uneventful and patient currently doing well with therapy, and therefore now stable to discharge home with home health.   Review of Systems:  Review of Systems  Constitutional: Negative for activity change, appetite change, fatigue and unexpected weight change.  HENT: Negative for congestion and hearing loss.   Eyes: Negative.   Respiratory: Negative for cough and shortness of breath.   Cardiovascular: Negative for chest pain, palpitations and leg swelling.  Gastrointestinal: Negative for abdominal pain, diarrhea and constipation.  Genitourinary: Negative for dysuria and difficulty urinating.  Musculoskeletal: Negative for myalgias and arthralgias.  Skin: Negative for color change and wound.  Neurological: Negative for dizziness and weakness.  Psychiatric/Behavioral: Negative for behavioral problems, confusion and agitation.    Past  Medical History  Diagnosis Date  . COPD (chronic obstructive pulmonary disease)   . Hypertension   . Diabetes mellitus   . MI (myocardial infarction)   . Hyperlipidemia 01/13/2012  . HTN (hypertension) 01/13/2012  . Diabetes mellitus, type 2 01/13/2012   Past Surgical History  Procedure Laterality Date  . Cholecystectomy    . Laparotomy N/A 09/09/2014    Procedure: EXPLORATORY LAPAROTOMY;  Surgeon: Frederik SchmidtJay Wyatt, MD;  [facility];  Service: General;  Laterality: N/A;  . Lysis of adhesion N/A 09/09/2014    Procedure: LYSIS OF ADHESION;  Surgeon: Frederik SchmidtJay Wyatt, MD;  Location: [facility] OR;  Service: General;  Laterality: N/A;   Social History:   reports that he has quit smoking. He does not have any smokeless tobacco history on file. He reports that he does not drink alcohol or use illicit drugs.  Family History  Problem Relation Age of Onset  . Cancer Father   . Seizures Son     Medications: Patient's Medications  New Prescriptions   No medications on file  Previous Medications   ACETAMINOPHEN (TYLENOL) 325 MG TABLET    Take 2 tablets (650 mg total) by mouth every 6 (six) hours as needed for mild pain, moderate pain, fever or headache.   ALBUTEROL (PROVENTIL) (2.5 MG/3ML) 0.083% NEBULIZER SOLUTION    Take 2.5 mg by nebulization every 6 (six) hours as needed for wheezing or shortness of breath.   ASPIRIN 81 MG CHEWABLE TABLET    Chew 81 mg by mouth daily.   ATORVASTATIN (LIPITOR) 40 MG TABLET    Take 40 mg by mouth every evening.   BUPROPION (WELLBUTRIN XL) 150 MG 24 HR TABLET    Take 150  mg by mouth daily.   FLUTICASONE-SALMETEROL (ADVAIR) 250-50 MCG/DOSE AEPB    Inhale 1 puff into the lungs 2 (two) times daily.   INSULIN GLARGINE (LANTUS) 100 UNIT/ML INJECTION    Inject 0.05 mLs (5 Units total) into the skin at bedtime.   LATANOPROST (XALATAN) 0.005 % OPHTHALMIC SOLUTION    Place 1 drop into both eyes at bedtime.   LISINOPRIL (PRINIVIL,ZESTRIL) 5 MG TABLET    Take 1 tablet (5 mg total) by  mouth daily.   METFORMIN (GLUCOPHAGE) 1000 MG TABLET    Take 1,000 mg by mouth 2 (two) times daily with a meal.   METOPROLOL SUCCINATE (TOPROL-XL) 50 MG 24 HR TABLET    Take 1 tablet (50 mg total) by mouth daily.   MULTIPLE VITAMIN (MULITIVITAMIN WITH MINERALS) TABS    Take 1 tablet by mouth daily.   PANTOPRAZOLE (PROTONIX) 40 MG TABLET    Take 1 tablet (40 mg total) by mouth daily.   TIOTROPIUM (SPIRIVA) 18 MCG INHALATION CAPSULE    Place 18 mcg into inhaler and inhale daily.  Modified Medications   No medications on file  Discontinued Medications   No medications on file     Physical Exam: Filed Vitals:   09/30/14 1553  BP: 133/79  Pulse: 68  Temp: 97.6 F (36.4 C)  Resp: 20    Physical Exam  Constitutional: He is oriented to person, place, and time. He appears well-developed and well-nourished. No distress.  HENT:  Head: Normocephalic and atraumatic.  Mouth/Throat: Oropharynx is clear and moist. No oropharyngeal exudate.  Eyes: Conjunctivae and EOM are normal. Pupils are equal, round, and reactive to light.  Neck: Normal range of motion. Neck supple.  Cardiovascular: Normal rate, regular rhythm and normal heart sounds.   Pulmonary/Chest: Effort normal and breath sounds normal.  Abdominal: Soft. Bowel sounds are normal.  abd wound healed with steri strips still intact  Musculoskeletal: He exhibits no edema or tenderness.  Neurological: He is alert and oriented to person, place, and time.  Skin: Skin is warm and dry. He is not diaphoretic.  Psychiatric: He has a normal mood and affect.    Labs reviewed: Basic Metabolic Panel:  Recent Labs  09/81/1910/28/15 0535  09/14/14 0500 09/15/14 0815 09/16/14 1245  NA  --   < > 143 143 142  K  --   < > 3.3* 4.1 4.9  CL  --   < > 107 107 106  CO2  --   < > 24 26 25   GLUCOSE  --   < > 137* 178* 156*  BUN  --   < > 13 10 9   CREATININE  --   < > 1.34 1.24 1.15  CALCIUM  --   < > 8.1* 7.9* 8.2*  MG 2.1  --  1.8 1.9  --   < > =  values in this interval not displayed. Liver Function Tests:  Recent Labs  10/27/13 1848 10/28/13 0800 09/05/14 1528  AST 22 15 18   ALT 19 16 11   ALKPHOS 72 66 105  BILITOT 0.3 0.4 0.7  PROT 6.8 6.5 7.7  ALBUMIN 3.6 3.4* 3.9    Recent Labs  09/05/14 1528  LIPASE 31   No results for input(s): AMMONIA in the last 8760 hours. CBC:  Recent Labs  10/28/13 0800 04/30/14 2320  09/11/14 0500 09/12/14 0424 09/14/14 0500  WBC 4.4 3.3*  < > 6.1 4.7 5.3  NEUTROABS 2.4 2.1  --  4.1  --   --  HGB 14.0 15.4  < > 14.5 13.9 13.9  HCT 39.7 43.4  < > 43.0 40.7 40.3  MCV 84.6 84.1  < > 87.2 85.7 84.7  PLT 127* 114*  < > 141* 139* 156  < > = values in this interval not displayed. TSH:  Recent Labs  09/14/14 1600  TSH 0.932   A1C: Lab Results  Component Value Date   HGBA1C 7.6* 09/06/2014   Lipid Panel: No results for input(s): CHOL, HDL, LDLCALC, TRIG, CHOLHDL, LDLDIRECT in the last 8760 hours.  Assessment/Plan 1. Essential hypertension Controlled on current regimen  2. Cardiomyopathy Cont with cardiology, conts on ACE inhibitor and beta blocker  3. Pneumonia, organism unspecified resolved  4. Type 2 diabetes mellitus without complication -conts metformin and lantus  5. SBO (small bowel obstruction) S/p exploratory lab with lysis of adhesions. Doing well. Reg diet with reg BMs. No abd pain noted, incision well healed   6. Lung nodule -will need outpatient follow up   pt is stable for discharge-will need PT/OT/Nursing/sw  per home health. Walker DME needed. Rx written.  will need to follow up with PCP within 2 weeks.

## 2014-10-31 ENCOUNTER — Emergency Department (HOSPITAL_COMMUNITY)
Admission: EM | Admit: 2014-10-31 | Discharge: 2014-11-01 | Disposition: A | Discharge status: Home / Self Care | Payer: Medicare Other | Attending: Emergency Medicine | Admitting: Emergency Medicine

## 2014-10-31 ENCOUNTER — Emergency Department (HOSPITAL_COMMUNITY): Payer: Medicare Other

## 2014-10-31 ENCOUNTER — Encounter (HOSPITAL_COMMUNITY): Payer: Self-pay | Admitting: Family Medicine

## 2014-10-31 DIAGNOSIS — Z794 Long term (current) use of insulin: Secondary | ICD-10-CM | POA: Insufficient documentation

## 2014-10-31 DIAGNOSIS — K219 Gastro-esophageal reflux disease without esophagitis: Secondary | ICD-10-CM | POA: Diagnosis not present

## 2014-10-31 DIAGNOSIS — I252 Old myocardial infarction: Secondary | ICD-10-CM | POA: Diagnosis not present

## 2014-10-31 DIAGNOSIS — E119 Type 2 diabetes mellitus without complications: Secondary | ICD-10-CM | POA: Insufficient documentation

## 2014-10-31 DIAGNOSIS — I10 Essential (primary) hypertension: Secondary | ICD-10-CM | POA: Insufficient documentation

## 2014-10-31 DIAGNOSIS — E785 Hyperlipidemia, unspecified: Secondary | ICD-10-CM | POA: Diagnosis not present

## 2014-10-31 DIAGNOSIS — J449 Chronic obstructive pulmonary disease, unspecified: Secondary | ICD-10-CM | POA: Insufficient documentation

## 2014-10-31 DIAGNOSIS — Z79899 Other long term (current) drug therapy: Secondary | ICD-10-CM | POA: Diagnosis not present

## 2014-10-31 DIAGNOSIS — Z7951 Long term (current) use of inhaled steroids: Secondary | ICD-10-CM | POA: Insufficient documentation

## 2014-10-31 DIAGNOSIS — Z87891 Personal history of nicotine dependence: Secondary | ICD-10-CM | POA: Insufficient documentation

## 2014-10-31 DIAGNOSIS — R079 Chest pain, unspecified: Secondary | ICD-10-CM | POA: Diagnosis not present

## 2014-10-31 DIAGNOSIS — Z7982 Long term (current) use of aspirin: Secondary | ICD-10-CM | POA: Diagnosis not present

## 2014-10-31 DIAGNOSIS — R109 Unspecified abdominal pain: Secondary | ICD-10-CM | POA: Diagnosis present

## 2014-10-31 LAB — CBC WITH DIFFERENTIAL/PLATELET
Basophils Absolute: 0 10*3/uL (ref 0.0–0.1)
Basophils Relative: 0 % (ref 0–1)
EOS ABS: 0.3 10*3/uL (ref 0.0–0.7)
Eosinophils Relative: 5 % (ref 0–5)
HCT: 41.7 % (ref 39.0–52.0)
HEMOGLOBIN: 13.9 g/dL (ref 13.0–17.0)
Lymphocytes Relative: 34 % (ref 12–46)
Lymphs Abs: 2 10*3/uL (ref 0.7–4.0)
MCH: 28.6 pg (ref 26.0–34.0)
MCHC: 33.3 g/dL (ref 30.0–36.0)
MCV: 85.8 fL (ref 78.0–100.0)
MONO ABS: 0.6 10*3/uL (ref 0.1–1.0)
MONOS PCT: 11 % (ref 3–12)
Neutro Abs: 2.9 10*3/uL (ref 1.7–7.7)
Neutrophils Relative %: 50 % (ref 43–77)
Platelets: 152 10*3/uL (ref 150–400)
RBC: 4.86 MIL/uL (ref 4.22–5.81)
RDW: 14.2 % (ref 11.5–15.5)
WBC: 5.8 10*3/uL (ref 4.0–10.5)

## 2014-10-31 LAB — COMPREHENSIVE METABOLIC PANEL
ALT: 14 U/L (ref 0–53)
ANION GAP: 13 (ref 5–15)
AST: 15 U/L (ref 0–37)
Albumin: 3.7 g/dL (ref 3.5–5.2)
Alkaline Phosphatase: 106 U/L (ref 39–117)
BILIRUBIN TOTAL: 0.5 mg/dL (ref 0.3–1.2)
BUN: 10 mg/dL (ref 6–23)
CO2: 28 mEq/L (ref 19–32)
CREATININE: 0.88 mg/dL (ref 0.50–1.35)
Calcium: 9.4 mg/dL (ref 8.4–10.5)
Chloride: 103 mEq/L (ref 96–112)
GFR calc Af Amer: >90 mL/min (ref >90)
GFR calc non Af Amer: 78 mL/min — ABNORMAL LOW (ref >90)
Glucose, Bld: 137 mg/dL — ABNORMAL HIGH (ref 70–99)
Potassium: 3.3 mEq/L — ABNORMAL LOW (ref 3.7–5.3)
Sodium: 144 mEq/L (ref 137–147)
TOTAL PROTEIN: 7.5 g/dL (ref 6.0–8.3)

## 2014-10-31 LAB — TROPONIN I
Troponin I: <0.3 ng/mL (ref <0.30)
Troponin I: <0.3 ng/mL (ref <0.30)

## 2014-10-31 LAB — URINALYSIS, ROUTINE W REFLEX MICROSCOPIC
BILIRUBIN URINE: NEGATIVE
Glucose, UA: 500 mg/dL — AB
Ketones, ur: NEGATIVE mg/dL
Leukocytes, UA: NEGATIVE
NITRITE: NEGATIVE
Protein, ur: 30 mg/dL — AB
Specific Gravity, Urine: 1.018 (ref 1.005–1.030)
UROBILINOGEN UA: 0.2 mg/dL (ref 0.0–1.0)
pH: 7 (ref 5.0–8.0)

## 2014-10-31 LAB — URINE MICROSCOPIC-ADD ON

## 2014-10-31 LAB — CBG MONITORING, ED: GLUCOSE-CAPILLARY: 123 mg/dL — AB (ref 70–99)

## 2014-10-31 LAB — LIPASE, BLOOD: LIPASE: 47 U/L (ref 11–59)

## 2014-10-31 LAB — D-DIMER, QUANTITATIVE: D-Dimer, Quant: 0.51 ug/mL-FEU — ABNORMAL HIGH (ref 0.00–0.48)

## 2014-10-31 MED ORDER — IOHEXOL 350 MG/ML SOLN
80.0000 mL | Freq: Once | INTRAVENOUS | Status: AC | PRN
  Administered 2014-10-31: 80 mL via INTRAVENOUS

## 2014-10-31 MED ORDER — SODIUM CHLORIDE 0.9 % IV BOLUS (SEPSIS)
500.0000 mL | Freq: Once | INTRAVENOUS | Status: AC
Start: 2014-10-31 — End: 2014-11-01
  Administered 2014-10-31: 500 mL via INTRAVENOUS

## 2014-10-31 NOTE — ED Notes (Signed)
Patient transported to CT 

## 2014-10-31 NOTE — ED Notes (Signed)
Attempted IV x 2 unsuccessful; pt requesting IV to be removed; informed pt of need for IV to be placed. Pt refusing at this time

## 2014-10-31 NOTE — ED Notes (Signed)
Per pt sts that he is hurting after he eats in his upper abdomen. sts since surgery a few months ago. Denies N,V,D.

## 2014-10-31 NOTE — ED Provider Notes (Signed)
CSN: [id number]     Arrival date & time 10/31/14  1339 History   First MD Initiated Contact with Patient 10/31/14 1616     Chief Complaint  Patient presents with  . Abdominal Pain     (Consider location/radiation/quality/duration/timing/severity/associated sxs/prior Treatment) Patient is a 80 y.o. male presenting with abdominal pain. The history is provided by the patient. No language interpreter was used.  Abdominal Pain Associated symptoms: chest pain   Associated symptoms: no chills, no constipation, no diarrhea, no dysuria, no fever, no nausea, no shortness of breath and no vomiting   Jim Shields is an 80 year old male with past medical history of COPD, hypertension, diabetes, hyperlipidemia, MI presenting to the ED with abdominal pain that has been ongoing for approximately 2-3 weeks localized upper aspects of the abdomen bilaterally. When asked to point where his pain is located, patient points to bilateral aspects of the ribs, mid chest region. Stated that the pain comes and goes, intermittent described as a sticking pain that is worse with eating and drinking. Stated the pain radiates and wraps around his back when he has these episodes. Stated that he does have history of SBO with surgery performed at the end of October. Stated that he's been passing gas without issues, reports that he had a bowel movement today and was reported normal. Denied fever, chills, nausea, vomiting, diarrhea, melena, hematochezia, dysuria, chest pain, shortness of breath, difficulty breathing, back pain, neck pain. PCP none  Past Medical History  Diagnosis Date  . COPD (chronic obstructive pulmonary disease)   . Hypertension   . Diabetes mellitus   . MI (myocardial infarction)   . Hyperlipidemia 01/13/2012  . HTN (hypertension) 01/13/2012  . Diabetes mellitus, type 2 01/13/2012   Past Surgical History  Procedure Laterality Date  . Cholecystectomy    . Laparotomy N/A 09/09/2014    Procedure:  EXPLORATORY LAPAROTOMY;  Surgeon: Frederik SchmidtJay Wyatt, MD;  [facility];  Service: General;  Laterality: N/A;  . Lysis of adhesion N/A 09/09/2014    Procedure: LYSIS OF ADHESION;  Surgeon: Frederik SchmidtJay Wyatt, MD;  Location: [facility] OR;  Service: General;  Laterality: N/A;   Family History  Problem Relation Age of Onset  . Cancer Father   . Seizures Son    History  Substance Use Topics  . Smoking status: Former Games developermoker  . Smokeless tobacco: Not on file  . Alcohol Use: No    Review of Systems  Constitutional: Negative for fever and chills.  Eyes: Negative for visual disturbance.  Respiratory: Negative for chest tightness and shortness of breath.   Cardiovascular: Positive for chest pain.  Gastrointestinal: Negative for nausea, vomiting, abdominal pain, diarrhea, constipation, blood in stool and anal bleeding.  Genitourinary: Negative for dysuria.  Musculoskeletal: Negative for back pain, neck pain and neck stiffness.  Neurological: Negative for dizziness, weakness and headaches.      Allergies  Review of patient's allergies indicates no known allergies.  Home Medications   Prior to Admission medications   Medication Sig Start Date End Date Taking? Authorizing Provider  acetaminophen (TYLENOL) 325 MG tablet Take 2 tablets (650 mg total) by mouth every 6 (six) hours as needed for mild pain, moderate pain, fever or headache. 09/16/14   Elease EtienneAnand D Hongalgi, MD  albuterol (PROVENTIL) (2.5 MG/3ML) 0.083% nebulizer solution Take 2.5 mg by nebulization every 6 (six) hours as needed for wheezing or shortness of breath.    Historical Provider, MD  aspirin 81 MG chewable tablet Chew 81 mg by mouth daily.  Historical Provider, MD  atorvastatin (LIPITOR) 40 MG tablet Take 40 mg by mouth every evening.    Historical Provider, MD  buPROPion (WELLBUTRIN XL) 150 MG 24 hr tablet Take 150 mg by mouth daily.    Historical Provider, MD  Fluticasone-Salmeterol (ADVAIR) 250-50 MCG/DOSE AEPB Inhale 1 puff into the lungs 2  (two) times daily.    Historical Provider, MD  insulin glargine (LANTUS) 100 UNIT/ML injection Inject 0.05 mLs (5 Units total) into the skin at bedtime. 09/16/14   Elease Etienne, MD  latanoprost (XALATAN) 0.005 % ophthalmic solution Place 1 drop into both eyes at bedtime.    Historical Provider, MD  lisinopril (PRINIVIL,ZESTRIL) 5 MG tablet Take 1 tablet (5 mg total) by mouth daily. 09/16/14   Elease Etienne, MD  metFORMIN (GLUCOPHAGE) 1000 MG tablet Take 1,000 mg by mouth 2 (two) times daily with a meal.    Historical Provider, MD  metoprolol succinate (TOPROL-XL) 50 MG 24 hr tablet Take 1 tablet (50 mg total) by mouth daily. 09/16/14   Elease Etienne, MD  Multiple Vitamin (MULITIVITAMIN WITH MINERALS) TABS Take 1 tablet by mouth daily.    Historical Provider, MD  omeprazole (PRILOSEC) 20 MG capsule Take 1 capsule (20 mg total) by mouth daily. 11/01/14   Jim Tourangeau, PA-C  pantoprazole (PROTONIX) 40 MG tablet Take 1 tablet (40 mg total) by mouth daily. 09/16/14   Elease Etienne, MD  tiotropium (SPIRIVA) 18 MCG inhalation capsule Place 18 mcg into inhaler and inhale daily.    Historical Provider, MD   BP 110/81 mmHg  Pulse 72  Temp(Src) 97.7 F (36.5 C) (Oral)  Resp 18  SpO2 97% Physical Exam  Constitutional: He is oriented to person, place, and time. He appears well-developed and well-nourished. No distress.  HENT:  Head: Normocephalic and atraumatic.  Eyes: Conjunctivae and EOM are normal. Pupils are equal, round, and reactive to light. Right eye exhibits no discharge. Left eye exhibits no discharge.  Neck: Normal range of motion. Neck supple. No tracheal deviation present.  Cardiovascular: Normal rate, regular rhythm and normal heart sounds.  Exam reveals no friction rub.   No murmur heard. Pulmonary/Chest: Effort normal and breath sounds normal. No respiratory distress. He has no wheezes. He has no rales. He exhibits no tenderness.  Patient is able to speak in full sentences  without difficulty Negative use of excess her muscles Negative stridor Negative pain upon palpation to the chest wall  Abdominal: Soft. Bowel sounds are normal. He exhibits no distension. There is no tenderness. There is no rebound and no guarding.  Negative abdominal distention Bowel sounds normoactive in all 4 quadrants Abdomen soft upon palpation Negative pain upon palpation to the abdomen Negative peritoneal signs  Musculoskeletal: Normal range of motion.  Lymphadenopathy:    He has no cervical adenopathy.  Neurological: He is alert and oriented to person, place, and time. No cranial nerve deficit. He exhibits normal muscle tone. Coordination normal.  Skin: Skin is warm and dry. No rash noted. He is not diaphoretic. No erythema.  Psychiatric: He has a normal mood and affect. His behavior is normal. Thought content normal.  Nursing note and vitals reviewed.   ED Course  Procedures (including critical care time)  Results for orders placed or performed during the hospital encounter of 10/31/14  CBC with Differential  Result Value Ref Range   WBC 5.8 4.0 - 10.5 K/uL   RBC 4.86 4.22 - 5.81 MIL/uL   Hemoglobin 13.9 13.0 - 17.0  g/dL   HCT 65.7 84.6 - 96.2 %   MCV 85.8 78.0 - 100.0 fL   MCH 28.6 26.0 - 34.0 pg   MCHC 33.3 30.0 - 36.0 g/dL   RDW 95.2 84.1 - 32.4 %   Platelets 152 150 - 400 K/uL   Neutrophils Relative % 50 43 - 77 %   Neutro Abs 2.9 1.7 - 7.7 K/uL   Lymphocytes Relative 34 12 - 46 %   Lymphs Abs 2.0 0.7 - 4.0 K/uL   Monocytes Relative 11 3 - 12 %   Monocytes Absolute 0.6 0.1 - 1.0 K/uL   Eosinophils Relative 5 0 - 5 %   Eosinophils Absolute 0.3 0.0 - 0.7 K/uL   Basophils Relative 0 0 - 1 %   Basophils Absolute 0.0 0.0 - 0.1 K/uL  Comprehensive metabolic panel  Result Value Ref Range   Sodium 144 137 - 147 mEq/L   Potassium 3.3 (L) 3.7 - 5.3 mEq/L   Chloride 103 96 - 112 mEq/L   CO2 28 19 - 32 mEq/L   Glucose, Bld 137 (H) 70 - 99 mg/dL   BUN 10 6 - 23  mg/dL   Creatinine, Ser 4.01 0.50 - 1.35 mg/dL   Calcium 9.4 8.4 - 02.7 mg/dL   Total Protein 7.5 6.0 - 8.3 g/dL   Albumin 3.7 3.5 - 5.2 g/dL   AST 15 0 - 37 U/L   ALT 14 0 - 53 U/L   Alkaline Phosphatase 106 39 - 117 U/L   Total Bilirubin 0.5 0.3 - 1.2 mg/dL   GFR calc non Af Amer 78 (L) >90 mL/min   GFR calc Af Amer >90 >90 mL/min   Anion gap 13 5 - 15  Lipase, blood  Result Value Ref Range   Lipase 47 11 - 59 U/L  Urinalysis, Routine w reflex microscopic  Result Value Ref Range   Color, Urine YELLOW YELLOW   APPearance CLOUDY (A) CLEAR   Specific Gravity, Urine 1.018 1.005 - 1.030   pH 7.0 5.0 - 8.0   Glucose, UA 500 (A) NEGATIVE mg/dL   Hgb urine dipstick TRACE (A) NEGATIVE   Bilirubin Urine NEGATIVE NEGATIVE   Ketones, ur NEGATIVE NEGATIVE mg/dL   Protein, ur 30 (A) NEGATIVE mg/dL   Urobilinogen, UA 0.2 0.0 - 1.0 mg/dL   Nitrite NEGATIVE NEGATIVE   Leukocytes, UA NEGATIVE NEGATIVE  Troponin I  Result Value Ref Range   Troponin I <0.30 <0.30 ng/mL  D-dimer, quantitative  Result Value Ref Range   D-Dimer, Quant 0.51 (H) 0.00 - 0.48 ug/mL-FEU  Urine microscopic-add on  Result Value Ref Range   Squamous Epithelial / LPF FEW (A) RARE   WBC, UA 0-2 <3 WBC/hpf   RBC / HPF 3-6 <3 RBC/hpf  Troponin I  Result Value Ref Range   Troponin I <0.30 <0.30 ng/mL  CBG monitoring, ED  Result Value Ref Range   Glucose-Capillary 123 (H) 70 - 99 mg/dL    Labs Review Labs Reviewed  COMPREHENSIVE METABOLIC PANEL - Abnormal; Notable for the following:    Potassium 3.3 (*)    Glucose, Bld 137 (*)    GFR calc non Af Amer 78 (*)    All other components within normal limits  URINALYSIS, ROUTINE W REFLEX MICROSCOPIC - Abnormal; Notable for the following:    APPearance CLOUDY (*)    Glucose, UA 500 (*)    Hgb urine dipstick TRACE (*)    Protein, ur 30 (*)  All other components within normal limits  D-DIMER, QUANTITATIVE - Abnormal; Notable for the following:    D-Dimer, Quant  0.51 (*)    All other components within normal limits  URINE MICROSCOPIC-ADD ON - Abnormal; Notable for the following:    Squamous Epithelial / LPF FEW (*)    All other components within normal limits  CBG MONITORING, ED - Abnormal; Notable for the following:    Glucose-Capillary 123 (*)    All other components within normal limits  CBC WITH DIFFERENTIAL  LIPASE, BLOOD  TROPONIN I  TROPONIN I    Imaging Review Ct Angio Chest Pe W/cm &/or Wo Cm  10/31/2014   CLINICAL DATA:  Bilateral upper rib pain for 2-3 weeks. Elevated D-dimer.  EXAM: CT ANGIOGRAPHY CHEST WITH CONTRAST  TECHNIQUE: Multidetector CT imaging of the chest was performed using the standard protocol during bolus administration of intravenous contrast. Multiplanar CT image reconstructions and MIPs were obtained to evaluate the vascular anatomy.  CONTRAST:  80mL OMNIPAQUE IOHEXOL 350 MG/ML SOLN  COMPARISON:  10/31/2014 chest radiograph, CT chest 09/07/2014  FINDINGS: Incidental note is made of left carotid arterial common origin from the right brachiocephalic artery common trunk. Moderate atheromatous aortic calcification without aneurysm. Cardiomegaly noted. No pericardial effusion. Calcified AP window, left hilar, and pretracheal lymph nodes are identified.  The examination is adequate for evaluation for acute pulmonary embolism up to and including the 3rd order pulmonary arteries. No focal filling defect is identified up to and including the 3rd order pulmonary arteries to suggest acute pulmonary embolism.  Diffuse emphysematous change is reidentified. Partly calcified 8 mm presumed lingular granuloma noted image 76. No acute osseous abnormality.  Incomplete imaging of the upper abdomen demonstrates cholecystectomy clips and hepatic and splenic granulomas. The stomach is decompressed with gastric wall thickness at upper limits of normal. Left upper renal pole cyst partly visualized.  Review of the MIP images confirms the above findings.   IMPRESSION: No CT evidence for acute pulmonary embolism or other acute cardiopulmonary process. Emphysema.   Electronically Signed   By: Christiana PellantGretchen  Green M.D.   On: 10/31/2014 22:05   Dg Abd Acute W/chest  10/31/2014   CLINICAL DATA:  Generalized abdominal pain after eating. History of exploratory laparotomy 2 months ago for possible small bowel obstruction. Initial encounter.  EXAM: ACUTE ABDOMEN SERIES (ABDOMEN 2 VIEW & CHEST 1 VIEW)  COMPARISON:  Acute abdominal series and abdominal CT 09/09/2014.  FINDINGS: There is stable cardiomegaly and aortic atherosclerosis. Mild atelectasis is present at both lung bases per left suprahilar nodular density appears unchanged from 2010, consistent with a vascular structure.  The bowel gas pattern is nonobstructive. There is no free intraperitoneal air or suspicious calcification. Scattered vascular calcifications and right upper quadrant surgical clips are stable. There are mild degenerative changes throughout the spine.  IMPRESSION: No acute cardiopulmonary or abdominal process. No evidence of recurrent bowel obstruction.   Electronically Signed   By: Roxy HorsemanBill  Veazey M.D.   On: 10/31/2014 19:06     EKG Interpretation   Date/Time:  Monday October 31 2014 16:41:37 EST Ventricular Rate:  78 PR Interval:  159 QRS Duration: 86 QT Interval:  398 QTC Calculation: 453 R Axis:   77 Text Interpretation:  Sinus arrhythmia LVH with secondary repolarization  abnormality no significant change since Nov 2015 Confirmed by Criss AlvineGOLDSTON   MD, SCOTT [phone]([id number]) on 10/31/2014 5:30:48 PM       5:34 PM Patient seen and assessed by attending physician, Dr. Alric RanS. Goldston. As  per physician reported that the abdominal exam was benign and that the patient was having more bilateral chest pain than abdominal pain. Reported that since patient recently had surgery at the end of October. Attending reported to ordered d-dimer and if elevated to get CT angiogram of the chest.   MDM   Final  diagnoses:  Gastroesophageal reflux disease, esophagitis presence not specified    Medications  sodium chloride 0.9 % bolus 500 mL (500 mLs Intravenous New Bag/Given 10/31/14 2112)  iohexol (OMNIPAQUE) 350 MG/ML injection 80 mL (80 mLs Intravenous Contrast Given 10/31/14 2128)    Filed Vitals:   10/31/14 2030 10/31/14 2045 10/31/14 2100 10/31/14 2215  BP: 198/91  110/81   Pulse: 80 71 68 72  Temp:    97.7 F (36.5 C)  TempSrc:    Oral  Resp: 15 22 24 18   SpO2: 98% 98% 99% 97%   This provider reviewed the patient's chart. Patient was seen and assessed in ED setting on 09/05/2014 diagnosed with chest pain and acute abdominal pain that corresponding with SBO. Patient was taken to surgery on 09/09/2014 for laparotomy for lysis of adhesions. EKG noted sinus arrhythmia LVH with secondary repolarization with a heart rate of 78 bpm-no change since last tracing performed in November 2015. Troponin negative elevation. Second troponin negative elevation. D-dimer elevated at 0.51. CBC negative elevated white blood cell count-negative left shift or leukocytosis noted. Hemoglobin 13.9, hematocrit 41.7. CMP noted potassium 3.3. Glucose 137-negative elevated anion gap. Lipase negative elevation. Urinalysis negative for nitrites or leukocytes-negative findings of infection. Trace of hemoglobin identified with red blood cell count of 3-6. Plain film of acute abdomen with chest no acute cardiopulmonary or abdominal processes identified. CT angiogram of chest negative for acute pulmonary embolism or other acute cortical pulmonary processes-emphysema identified. Patient first presenting to the ED with abdominal pain, appears to be bilateral chest pain. D-dimer elevated with negative findings of pulmonary embolism on CT angiogram. Patient seen and assessed by attending physician, Dr. Alric Ran - as per physician agreed to plan of discharge and recommended patient to be discharged with Prilosec. Abdominal exam  benign. Suspicion to be possible GERD like symptoms. Patient stable, afebrile. Patient not septic appearing. Discharged patient. Discussed with patient to rest and stay hydrated. Discharged patient with Prilosec. Discussed with patient proper diet. Discussed with patient to closely monitor symptoms and if symptoms are to worsen or change to report back to the ED - strict return instructions given.  Patient agreed to plan of care, understood, all questions answered.   Raymon Mutton, PA-C 11/01/14 1610  Audree Camel, MD 11/02/14 [phone]

## 2014-10-31 NOTE — ED Notes (Signed)
Checked patient cbg it was 123 notified RN of blood sugar

## 2014-10-31 NOTE — ED Notes (Signed)
Pt to ED c/o abd pain on both sides.  Pt st's he has pain after eating.  Pt had exp. Lap in Oct for possible small bowel obstruction.  Pt denies nausea or vomiting.

## 2014-11-01 MED ORDER — OMEPRAZOLE 20 MG PO CPDR: 20.0000 mg | DELAYED_RELEASE_CAPSULE | Freq: Every day | ORAL | Status: AC

## 2014-11-01 NOTE — Discharge Instructions (Signed)
Please call your doctor for a followup appointment within 24-48 hours. When you talk to your doctor please let them know that you were seen in the emergency department and have them acquire all of your records so that they can discuss the findings with you and formulate a treatment plan to fully care for your new and ongoing problems. Please call and set-up an appointment with your primary care provider Please rest and stay hydrated Please avoid any spicy or fatty foods Please take medications as prescribed Please continue to monitor symptoms closely and if symptoms are to worsen or change (fever greater than 101, chills, sweating, nausea, vomiting, chest pain, shortness of breathe, difficulty breathing, weakness, numbness, tingling, worsening or changes to pain pattern, blood in the stool, black tarry stools, coughing up blood, inability to keep any food or fluids down) please report back to the Emergency Department immediately.   Gastroesophageal Reflux Disease, Adult Gastroesophageal reflux disease (GERD) happens when acid from your stomach flows up into the esophagus. When acid comes in contact with the esophagus, the acid causes soreness (inflammation) in the esophagus. Over time, GERD may create small holes (ulcers) in the lining of the esophagus. CAUSES   Increased body weight. This puts pressure on the stomach, making acid rise from the stomach into the esophagus.  Smoking. This increases acid production in the stomach.  Drinking alcohol. This causes decreased pressure in the lower esophageal sphincter (valve or ring of muscle between the esophagus and stomach), allowing acid from the stomach into the esophagus.  Late evening meals and a full stomach. This increases pressure and acid production in the stomach.  A malformed lower esophageal sphincter. Sometimes, no cause is found. SYMPTOMS   Burning pain in the lower part of the mid-chest behind the breastbone and in the mid-stomach  area. This may occur twice a week or more often.  Trouble swallowing.  Sore throat.  Dry cough.  Asthma-like symptoms including chest tightness, shortness of breath, or wheezing. DIAGNOSIS  Your caregiver may be able to diagnose GERD based on your symptoms. In some cases, X-rays and other tests may be done to check for complications or to check the condition of your stomach and esophagus. TREATMENT  Your caregiver may recommend over-the-counter or prescription medicines to help decrease acid production. Ask your caregiver before starting or adding any new medicines.  HOME CARE INSTRUCTIONS   Change the factors that you can control. Ask your caregiver for guidance concerning weight loss, quitting smoking, and alcohol consumption.  Avoid foods and drinks that make your symptoms worse, such as:  Caffeine or alcoholic drinks.  Chocolate.  Peppermint or mint flavorings.  Garlic and onions.  Spicy foods.  Citrus fruits, such as oranges, lemons, or limes.  Tomato-based foods such as sauce, chili, salsa, and pizza.  Fried and fatty foods.  Avoid lying down for the 3 hours prior to your bedtime or prior to taking a nap.  Eat small, frequent meals instead of large meals.  Wear loose-fitting clothing. Do not wear anything tight around your waist that causes pressure on your stomach.  Raise the head of your bed 6 to 8 inches with wood blocks to help you sleep. Extra pillows will not help.  Only take over-the-counter or prescription medicines for pain, discomfort, or fever as directed by your caregiver.  Do not take aspirin, ibuprofen, or other nonsteroidal anti-inflammatory drugs (NSAIDs). SEEK IMMEDIATE MEDICAL CARE IF:   You have pain in your arms, neck, jaw, teeth, or back.  Your pain increases or changes in intensity or duration.  You develop nausea, vomiting, or sweating (diaphoresis).  You develop shortness of breath, or you faint.  Your vomit is green, yellow,  black, or looks like coffee grounds or blood.  Your stool is red, bloody, or black. These symptoms could be signs of other problems, such as heart disease, gastric bleeding, or esophageal bleeding. MAKE SURE YOU:   Understand these instructions.  Will watch your condition.  Will get help right away if you are not doing well or get worse. Document Released: 08/07/2005 Document Revised: 01/20/2012 Document Reviewed: 05/17/2011 [facility] Patient Information 2015 [place], [place]. This information is not intended to replace advice given to you by your health care provider. Make sure you discuss any questions you have with your health care provider. Food Choices for Gastroesophageal Reflux Disease When you have gastroesophageal reflux disease (GERD), the foods you eat and your eating habits are very important. Choosing the right foods can help ease the discomfort of GERD. WHAT GENERAL GUIDELINES DO I NEED TO FOLLOW?  Choose fruits, vegetables, whole grains, low-fat dairy products, and low-fat meat, fish, and poultry.  Limit fats such as oils, salad dressings, butter, nuts, and avocado.  Keep a food diary to identify foods that cause symptoms.  Avoid foods that cause reflux. These may be different for different people.  Eat frequent small meals instead of three large meals each day.  Eat your meals slowly, in a relaxed setting.  Limit fried foods.  Cook foods using methods other than frying.  Avoid drinking alcohol.  Avoid drinking large amounts of liquids with your meals.  Avoid bending over or lying down until 2-3 hours after eating. WHAT FOODS ARE NOT RECOMMENDED? The following are some foods and drinks that may worsen your symptoms: Vegetables Tomatoes. Tomato juice. Tomato and spaghetti sauce. Chili peppers. Onion and garlic. Horseradish. Fruits Oranges, grapefruit, and lemon (fruit and juice). Meats High-fat meats, fish, and poultry. This includes hot dogs, ribs, ham,  sausage, salami, and bacon. Dairy Whole milk and chocolate milk. Sour cream. Cream. Butter. Ice cream. Cream cheese.  Beverages Coffee and tea, with or without caffeine. Carbonated beverages or energy drinks. Condiments Hot sauce. Barbecue sauce.  Sweets/Desserts Chocolate and cocoa. Donuts. Peppermint and spearmint. Fats and Oils High-fat foods, including [place] fries and potato chips. Other Vinegar. Strong spices, such as black pepper, white pepper, red pepper, cayenne, curry powder, cloves, ginger, and chili powder. The items listed above may not be a complete list of foods and beverages to avoid. Contact your dietitian for more information. Document Released: 10/28/2005 Document Revised: 11/02/2013 Document Reviewed: 09/01/2013 [facility] Patient Information 2015 [place], [place]. This information is not intended to replace advice given to you by your health care provider. Make sure you discuss any questions you have with your health care provider.   Emergency Department Resource Guide 1) Find a Doctor and Pay Out of Pocket Although you won't have to find out who is covered by your insurance plan, it is a good idea to ask around and get recommendations. You will then need to call the office and see if the doctor you have chosen will accept you as a new patient and what types of options they offer for patients who are self-pay. Some doctors offer discounts or will set up payment plans for their patients who do not have insurance, but you will need to ask so you aren't surprised when you get to your appointment.  2) Contact Your Local  Health Department Not all health departments have doctors that can see patients for sick visits, but many do, so it is worth a call to see if yours does. If you don't know where your local health department is, you can check in your phone book. The CDC also has a tool to help you locate your state's health department, and many state websites also have listings of  all of their local health departments.  3) Find a Walk-in Clinic If your illness is not likely to be very severe or complicated, you may want to try a walk in clinic. These are popping up all over the country in pharmacies, drugstores, and shopping centers. They're usually staffed by nurse practitioners or physician assistants that have been trained to treat common illnesses and complaints. They're usually fairly quick and inexpensive. However, if you have serious medical issues or chronic medical problems, these are probably not your best option.  No Primary Care Doctor: - Call Health Connect at  [phone] - they can help you locate a primary care doctor that  accepts your insurance, provides certain services, etc. - Physician Referral Service- [phone]  Chronic Pain Problems: Organization         Address  Phone   Notes  [facility]  [phone] Patients need to be referred by their primary care doctor.   Medication Assistance: Organization         Address  Phone   Notes  [facility] Medication [facility] [address] [place], [place] [address] [phone]) [phone] --Must be a resident of [facility] [place] -- Must have NO insurance coverage whatsoever (no Medicaid/ Medicare, etc.) -- The pt. MUST have a primary care doctor that directs their care regularly and follows them in the community   MedAssist  [phone]   [employer]  [phone]) [phone]    Agencies that provide inexpensive medical care: Organization         Address  Phone   Notes  [facility]  [phone]   [facility]    [phone]) [phone]   [facility] [address] [place], [place] [address] [phone]   [facility] [address], [place] [phone]) [phone]   Planned Parenthood    [phone]   [facility]    [phone]) [phone]   [facility]  [address],  [place] Phone:  [phone], Fax:  [phone]) [phone] Hours of Operation:  9 am - 6 pm, M-F.  Also accepts Medicaid/Medicare and self-pay.  [facility]  [address], [place] Phone: [phone]) [phone], Fax: [phone]) [phone]. Hours of Operation:  8:30 am - 5:30 pm, M-F.  Also accepts Medicaid and self-pay.  [facility] [place] [address], [place] Phone: [phone]) [phone]   [facility] [address], [place], [place] [phone], Ext. [phone] Mondays & Thursdays: 7-9 AM.  First 15 patients are seen on a first come, first serve basis.    Medicaid-accepting [facility] [place] [facility] Providers:  Organization         Address  Phone   Notes  [facility] [address], [place] [phone] Also accepts self-pay patients.  [facility] [address], [place]  [phone]   [facility] [address], [place] [phone]) [phone]  [facility] [address] [place] [phone]   Renaye Rakers [address], [place]   [phone] Only accepts [place] Access [place] patients after they have their name applied to their card.   Self-Pay (no insurance) in [facility]:  Organization         Address  Phone   Notes  Sickle Cell Patients, [facility] [address] [place], [place] [phone]   [facility] [address] [place], [place] [phone]   [facility]  [address], [place] [phone]   [facility]/Dr. Osei-Bonsu  [address] [place] or [address], [place] [phone] Phone number for both [place] and [place] locations is the same.  [facility] and [facility] [address] [place] [phone]   [facility] [address], [place] or [address] [phone]   [facility] [address] [place] [phone], phone; [phone], fax Sees patients 1st and 3rd Saturday of every month.  Must not qualify for public or private insurance (i.e. Medicaid, Medicare, [place] Health Choice, Veterans' Benefits)  Household income should be no more than 200% of the poverty level The clinic cannot treat you if you are pregnant or think you are pregnant  Sexually transmitted diseases are not treated at the clinic.    Dental Care: Organization         Address  Phone  Notes  [facility] [facility] [address] [place], [place] [phone] Accepts children up to age 46 who are enrolled in [place] or [place] Health Choice; pregnant women with a Medicaid card; and children who have applied for Medicaid or [place] Health Choice, but were declined, whose parents can pay a reduced fee at time of service.  [facility]  [address], [place] [phone] Accepts children up to age 42 who are enrolled in [place] or [place] Health Choice; pregnant women with a Medicaid card; and children who have applied for Medicaid or [place] Health Choice, but were declined, whose parents can pay a reduced fee at time of service.  [facility]  [address] [place], [place] [phone] Patients are seen by appointment only. Walk-ins are not accepted. [facility] will see patients 49 years of age and older. Monday - Tuesday (8am-5pm) Most Wednesdays (8:30-5pm) $30 per visit, cash only  [facility] Adult Dental Access PROGRAM  [address], [facility] [phone] Patients are seen by appointment only. Walk-ins are not accepted. [facility] will see patients 66 years of age and older. One Wednesday Evening (Monthly: Volunteer Based).  $30 per visit, cash only  [employer]   [phone] for adults; Children under age 64, call Graduate Pediatric Dentistry at [phone]. Children aged 65-14, please call [phone] to request a pediatric application.  Dental services are provided in all areas of dental care including fillings, crowns and bridges, complete and partial dentures, implants, gum treatment, root canals, and extractions. Preventive care is also provided. Treatment is provided to both adults and children. Patients are selected via a lottery and there is often a waiting list.   [facility] [address] [place]  [phone] [email or web address]  [facility] [address] [place] [address] [place], [place] [phone], Ext. [phone] Second and Fourth Thursday of each month, opens at 6:30 AM; Clinic ends at 9 AM.  Patients are seen on a first-come first-served basis, and a limited number are seen during each clinic.   [facility]  [address] [place], [place] [phone]   Eligibility Requirements You must have lived in [place], [place], or [place] counties for at least the last three months.   You cannot be eligible for state or federal sponsored [place], including [employer], [place], or [employer].   You generally cannot be eligible for healthcare insurance through your employer.    How to apply: Eligibility screenings are held every Tuesday and Wednesday afternoon from 1:00 pm until 4:00 pm. You do not need an appointment for the interview!  [facility] [address], [place], [place] [phone]   [facility]  [phone]   [facility] Health Department  [phone]   [facility] Health Department  [phone]    Behavioral Health Resources in the Community: Intensive Outpatient Programs Organization         Address  Phone  Notes  [facility] [address], [place], [place] [phone]   [facility]patient [address] [place], [place] [phone]   [facility] [address] [place], [place]  [phone]   [facility] [address],  [place], [place] [phone] or [phone]   Substance Abuse Resources Organization         Address  Phone  Notes  [facility]  [phone]   [facility]  [phone]   [facility] [place]  [phone]   Floydene Flock  [phone]   Residential & Outpatient Substance Abuse Program  [phone]   Psychological Services Organization         Address  Phone  Notes  [facility]  [phone]   [facility]  [phone]   [facility] [address] [place] [phone] or [phone]    Mobile Crisis Teams Organization         Address  Phone  Notes  [facility], Mobile Crisis Care Unit  [phone]   [facility]  [address] [place], [place] [phone]   Doristine Locks [address] [place] [phone]    Self-Help/Support Groups Organization         Address  Phone             Notes  [facility] [place] - variety of support groups  336- I7437963 Call for more information  Narcotics Anonymous (NA), [facility] [address], [employer] [place]  2 meetings at this location   Statistician         Address  Phone  Notes  [facility] [address],    [place]  [phone]   [facility]  [address], [place] [address], [place], [place] [phone]   [facility] [address] [place], [place] [phone] Admissions: 8am-3pm M-F  [facility] [address]    [place], [place] [phone]   [facility] [address] [place], [place] [phone]   [facility] [address]  [place], [place] [phone]     [facility] - Intensive Outpatient [phone]  [address], [place], [place] [phone]   [facility] ([facility] [address]  [place], [place] [phone] or [phone]   [facility] ([facility]) [address] [place], [place] [phone] Accepts Medicaid  [facility] [address]  [place] [phone] Substance Abuse/Addiction Treatment   [facility] Behavioral Health Resources Organization         Address  Phone  Notes  [facility]  [phone](888) [phone]   Angie FavaJulie Brannon, PhD [address] [place], [place]   [phone]) [phone] or [phone]) [phone]   [facility]   [address] [place], [place] [phone]) [phone]   [facility] [address], [place], [place] [phone]) [phone] Insurance/Medicaid/sponsorship through [facility]  [facility] [address]                                    [place], [place] [phone]) [phone] Therapy/tele-psych/case  [facility] [address].   [place], [place] [phone]) [phone]    Dr. Lolly MustacheArfeen  [phone](336) [phone]   [facility]  [employer] [facility] 1) [address], [place] 2) [address], [place] 3)  [address], [place] [phone](336) [phone]  [phone](336) [phone]   [facility] Child Abuse Hotline [phone](336) [phone] or [phone](336) [phone] (After Hours)

## 2014-11-01 NOTE — ED Notes (Signed)
Unable to obtain pt's signature, pt reports he understands discharge instructions and has no other questions.

## 2015-01-11 ENCOUNTER — Other Ambulatory Visit: Payer: Self-pay | Admitting: Nurse Practitioner

## 2015-01-24 ENCOUNTER — Other Ambulatory Visit: Payer: Self-pay | Admitting: Nurse Practitioner

## 2015-01-28 ENCOUNTER — Other Ambulatory Visit: Payer: Self-pay | Admitting: Nurse Practitioner

## 2015-05-08 ENCOUNTER — Other Ambulatory Visit: Payer: Self-pay

## 2016-01-17 IMAGING — CT CT CTA ABD/PEL W/CM AND/OR W/O CM
2 of 9 series · 13 of 46 positions shown, 15 images · IV contrast (omnipaque)
Comparison: CT scan of May 15, 2012.

CLINICAL DATA: Acute chest and abdominal pain.

EXAM:
CT ANGIOGRAPHY CHEST, ABDOMEN AND PELVIS
TECHNIQUE: Multidetector CT imaging through the chest, abdomen and pelvis was
performed using the standard protocol during bolus administration of
intravenous contrast. Multiplanar reconstructed images and MIPs were
obtained and reviewed to evaluate the vascular anatomy.
CONTRAST:  100 mL of Omnipaque 350 intravenously.

[Series 5: dissection 2.0 i30f 1 · axial · 0.73mm/px · z∈[-760,-190]mm · 10 of 321 slices shown, 12 images]
[im 18/321  soft-tissue]
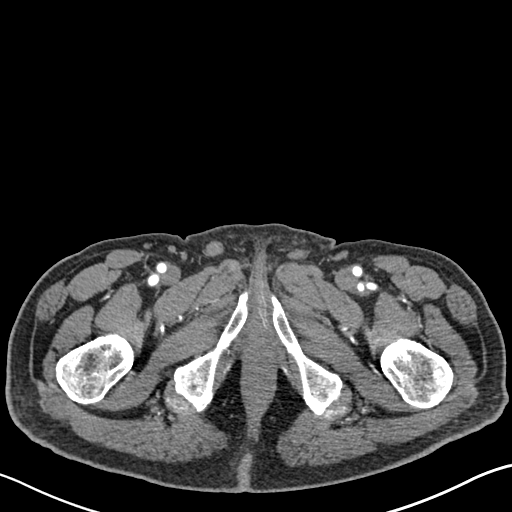
[im 18/321  bone]
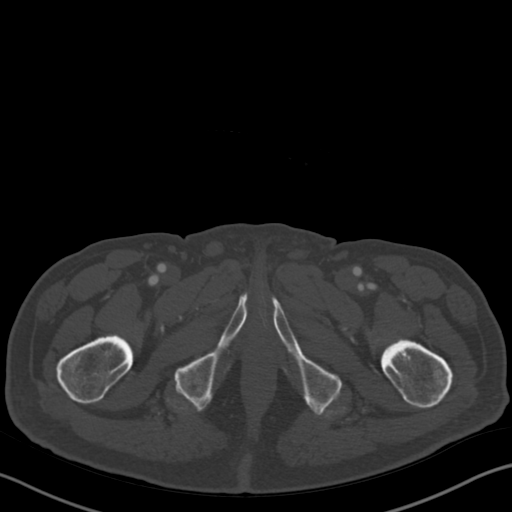
[im 54/321  soft-tissue]
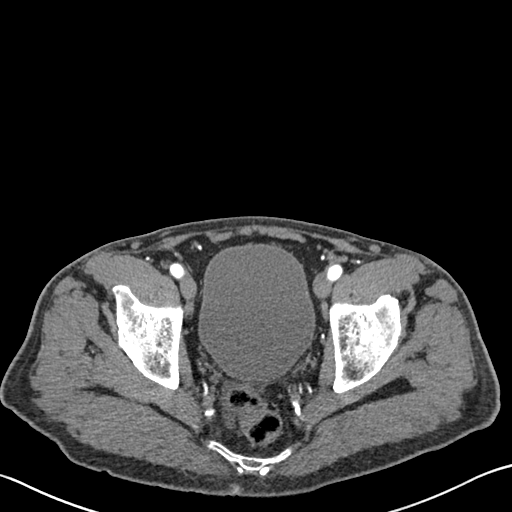
[im 89/321  soft-tissue]
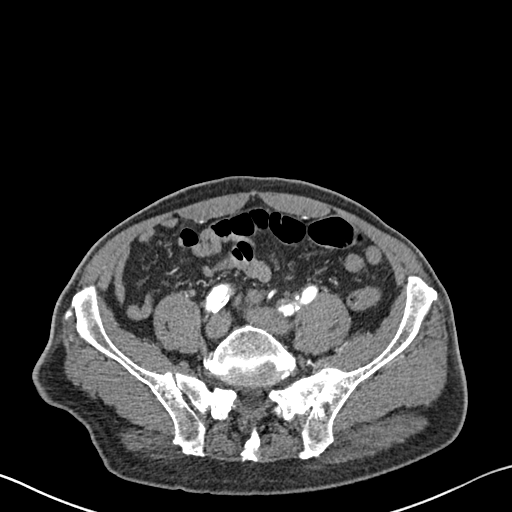
[im 107/321  soft-tissue]
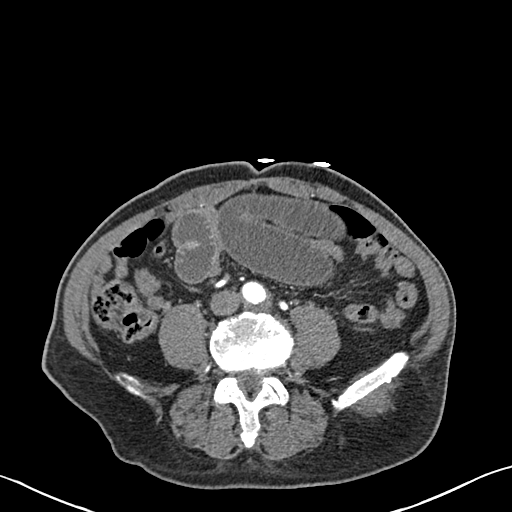
[im 143/321  soft-tissue]
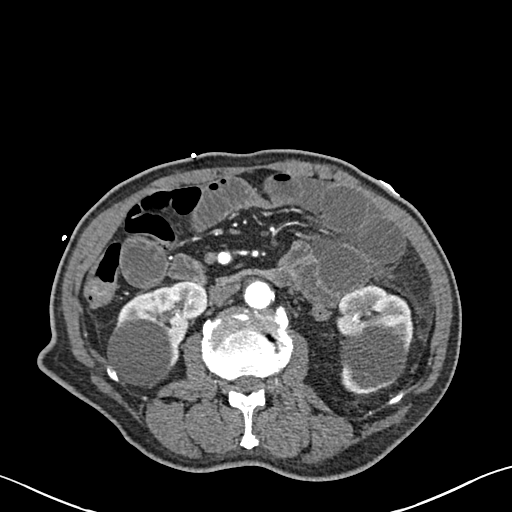
[im 178/321  soft-tissue]
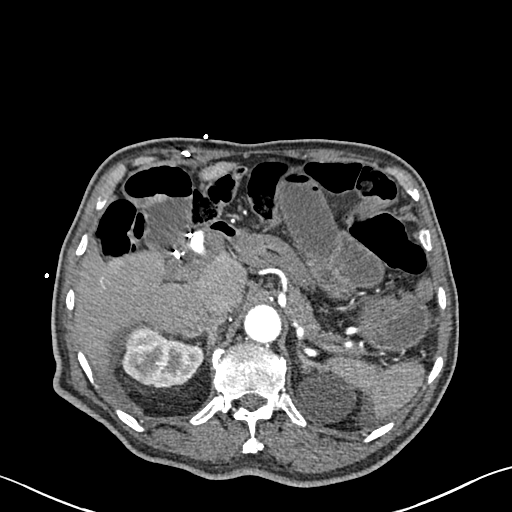
[im 214/321  soft-tissue]
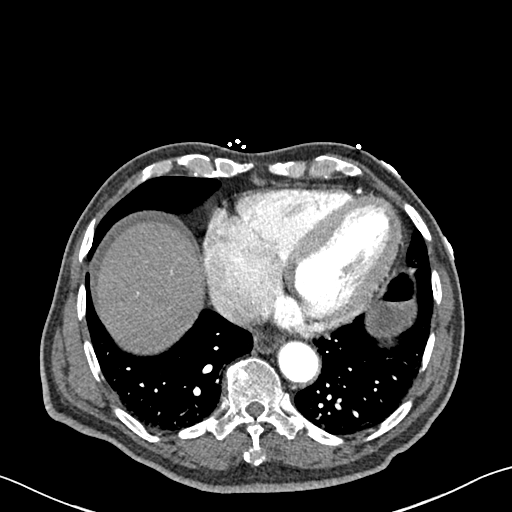
[im 232/321  soft-tissue]
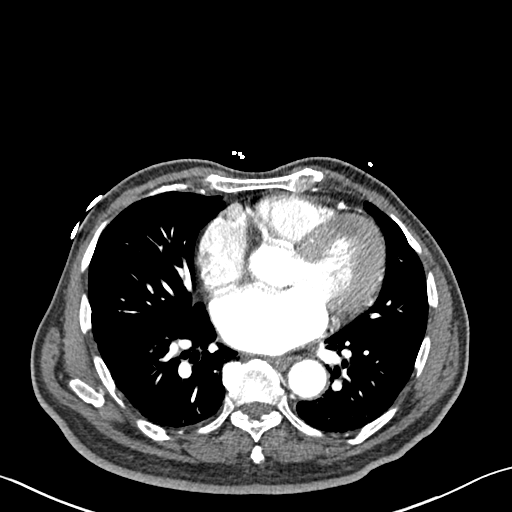
[im 267/321  soft-tissue]
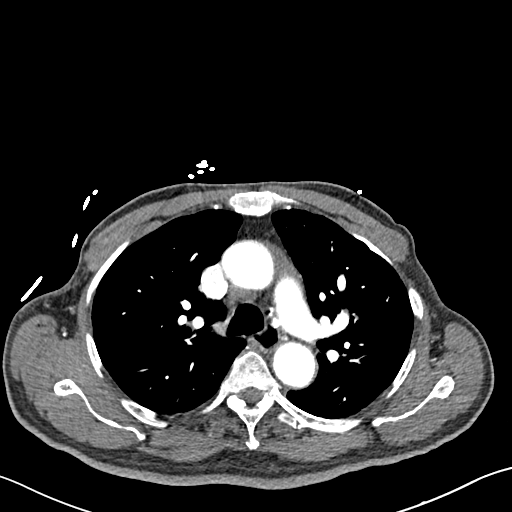
[im 267/321  bone]
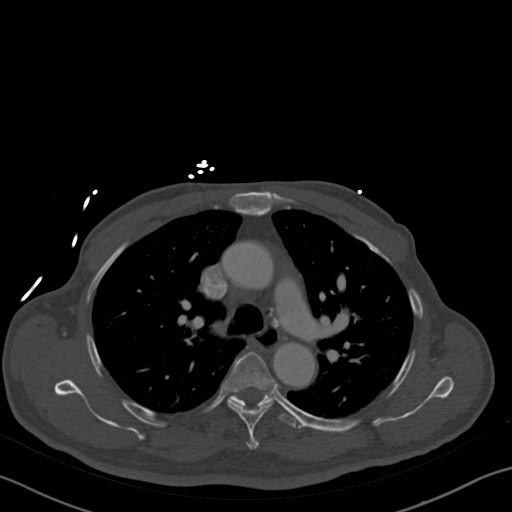
[im 303/321  soft-tissue]
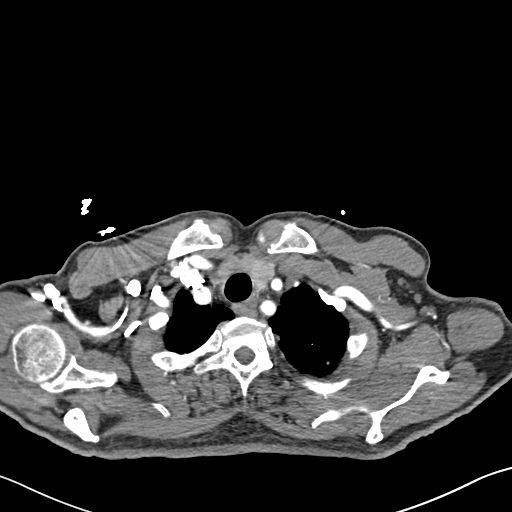

[Series 7: coronal mpr · coronal · 0.68mm/px · 3 of 114 slices shown]
[im 29/114  soft-tissue]
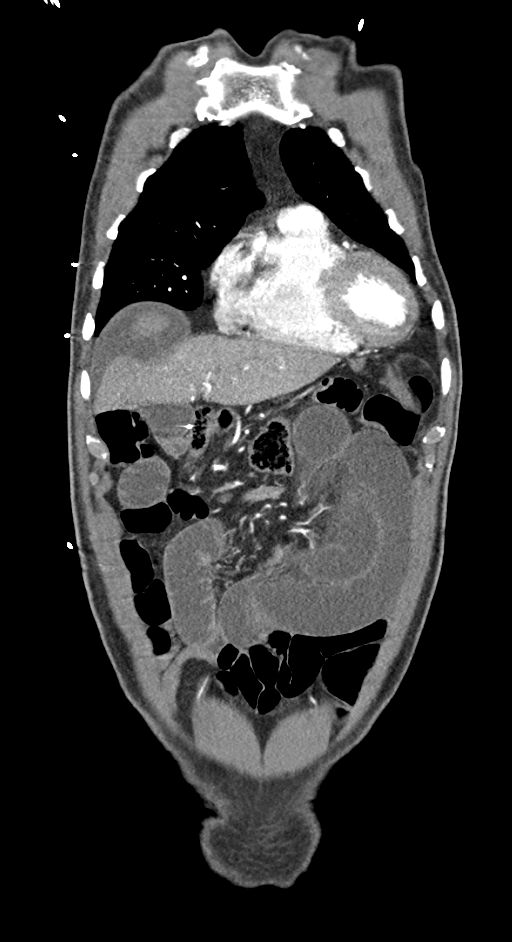
[im 57/114  soft-tissue]
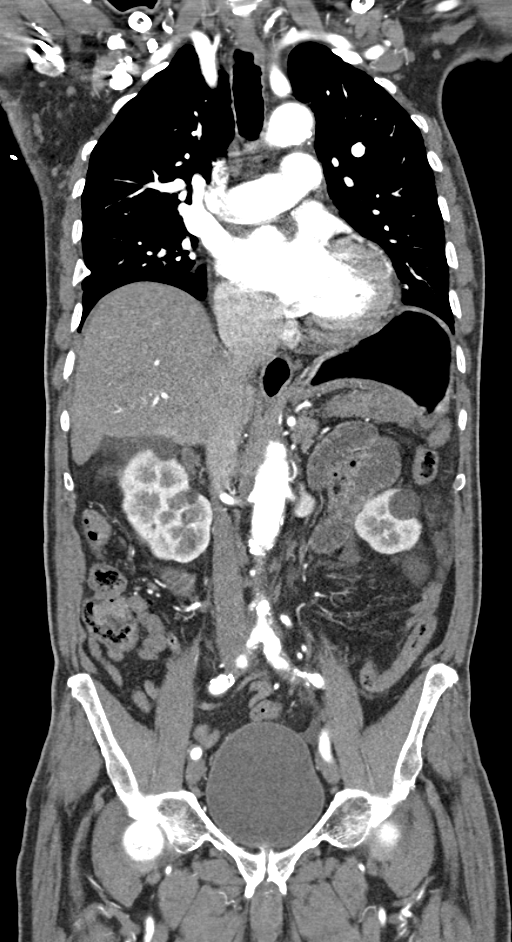
[im 85/114  soft-tissue]
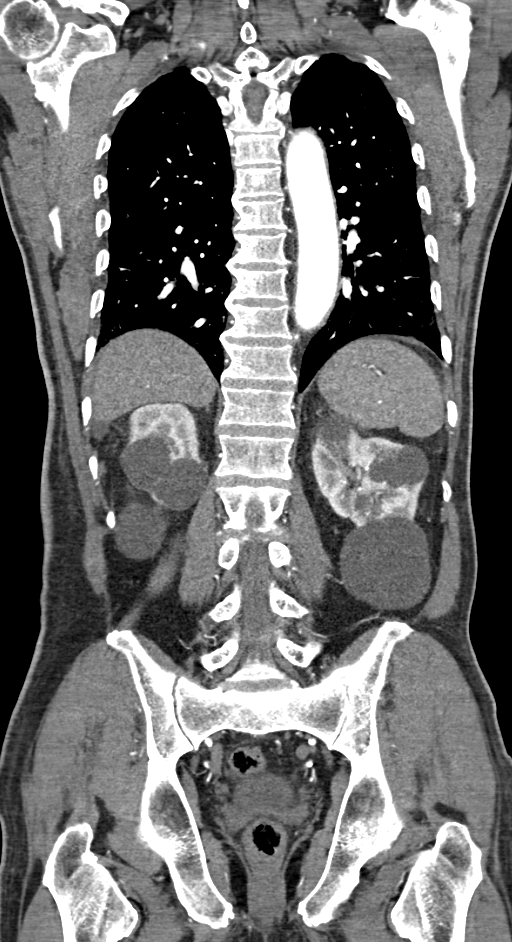

[13 of 46 positions shown; findings below may reference images not displayed]

FINDINGS: CTA CHEST FINDINGS

Mild bilateral apical scarring is noted. No pneumothorax or pleural
effusion is noted. Mild emphysematous changes noted in the upper
lobes bilaterally. There is no evidence of thoracic aortic
dissection or aneurysm. There is no evidence of pulmonary embolus.
Calcified bilateral hilar lymph nodes are noted. No other
mediastinal mass or adenopathy is noted. 7 mm nodule is noted
inferiorly in the left lingula. Great vessels are widely patent.

Review of the MIP images confirms the above findings.

CTA ABDOMEN AND PELVIS FINDINGS

There is no evidence of abdominal aortic aneurysm or dissection. The
renal arteries are widely patent without significant stenosis. The
celiac and superior mesenteric arteries are also widely patent
without significant stenosis. There is moderate stenosis involving
the origin of the inferior mesenteric artery. Mild atherosclerotic
calcifications of the abdominal aorta and iliac arteries are noted.
Status post cholecystectomy. Bilateral renal cysts are again noted
and unchanged compared to prior exam. Small amount of free fluid is
noted around the liver and spleen. No significant abnormality is
noted in the liver, spleen or pancreas. The appendix appears normal.
Urinary bladder appears normal. No significant adenopathy is noted.
Moderate to severe proximal small bowel dilatation is noted with
possible transition zone seen in the central portion of the abdomen
anteriorly best seen on image number 178 of series 5. This is
concerning for possible adhesion.

Review of the MIP images confirms the above findings.
IMPRESSION: No evidence of thoracic or abdominal aortic dissection or aneurysm.

No evidence of pulmonary embolus.

Moderate to severe proximal small bowel dilatation is noted with
possible transition zone seen in the central portion of the abdomen.
This is concerning for possible adhesion.

7 mm lingular nodule is noted. If the patient is at high risk for
bronchogenic carcinoma, follow-up chest CT at 3-8months is
recommended. If the patient is at low risk for bronchogenic
carcinoma, follow-up chest CT at 6-12 months is recommended. This
recommendation follows the consensus statement: Guidelines for
Management of Small Pulmonary Nodules Detected on CT Scans: A
Statement from the [HOSPITAL] as published in Radiology

## 2021-05-11 DEATH — deceased
